# Patient Record
Sex: Male | Born: 1945 | Race: White | Hispanic: No | Marital: Single | Smoking: Former smoker
Health system: Southern US, Community
[De-identification: ages and names within clinical notes are randomized; demographics above are authoritative.]

## PROBLEM LIST (undated history)

## (undated) DIAGNOSIS — G4730 Sleep apnea, unspecified: Secondary | ICD-10-CM

## (undated) DIAGNOSIS — M1990 Unspecified osteoarthritis, unspecified site: Secondary | ICD-10-CM

## (undated) DIAGNOSIS — K219 Gastro-esophageal reflux disease without esophagitis: Secondary | ICD-10-CM

## (undated) HISTORY — PX: BACK SURGERY: SHX140

---

## 2016-12-16 DIAGNOSIS — W57XXXA Bitten or stung by nonvenomous insect and other nonvenomous arthropods, initial encounter: Secondary | ICD-10-CM | POA: Diagnosis not present

## 2016-12-16 DIAGNOSIS — S30860A Insect bite (nonvenomous) of lower back and pelvis, initial encounter: Secondary | ICD-10-CM | POA: Diagnosis not present

## 2017-05-06 DIAGNOSIS — R1013 Epigastric pain: Secondary | ICD-10-CM | POA: Diagnosis not present

## 2017-05-06 DIAGNOSIS — R030 Elevated blood-pressure reading, without diagnosis of hypertension: Secondary | ICD-10-CM | POA: Diagnosis not present

## 2017-07-25 DIAGNOSIS — L821 Other seborrheic keratosis: Secondary | ICD-10-CM | POA: Diagnosis not present

## 2017-07-25 DIAGNOSIS — M25562 Pain in left knee: Secondary | ICD-10-CM | POA: Diagnosis not present

## 2017-07-25 DIAGNOSIS — L989 Disorder of the skin and subcutaneous tissue, unspecified: Secondary | ICD-10-CM | POA: Diagnosis not present

## 2017-08-26 ENCOUNTER — Encounter: Payer: Self-pay | Admitting: Orthopaedic Surgery

## 2017-08-26 ENCOUNTER — Ambulatory Visit (INDEPENDENT_AMBULATORY_CARE_PROVIDER_SITE_OTHER): Payer: Medicare Other | Admitting: Orthopaedic Surgery

## 2017-08-26 VITALS — BP 127/86 | HR 103 | Temp 97.4°F | Ht 66.0 in | Wt 247.0 lb

## 2017-08-26 DIAGNOSIS — G8929 Other chronic pain: Secondary | ICD-10-CM

## 2017-08-26 DIAGNOSIS — M25562 Pain in left knee: Secondary | ICD-10-CM

## 2017-08-26 MED ORDER — MELOXICAM 7.5 MG PO TABS: 7.5000 mg | ORAL_TABLET | Freq: Every day | ORAL | 2 refills | Status: DC

## 2017-08-26 NOTE — Progress Notes (Signed)
Subjective:    Patient ID: Peter Mora, male    DOB: 08-Feb-1946, 71 y.o.   MRN: [id number]  HPI He has had pain of the left knee for several months.  He has had swelling and popping and feeling it will give way but has not.  He has seen his family doctor for this.  He has tried ice and heat and brace.  The pain got worse this past weekend and he went to the ER at [place] were done and showed just mild DJD, no fracture.  He was given Mobic 7.5 which helped.  He was told he would need MRI.  I will continue the Mobic and schedule for MRI.    I have reviewed the notes and x-rays from [place]: Negative for congestion.   Respiratory: Negative for cough and shortness of breath.   Cardiovascular: Negative for chest pain and leg swelling.  Endocrine: Negative for cold intolerance.  Musculoskeletal: Positive for arthralgias, gait problem and joint swelling.  Allergic/Immunologic: Negative for environmental allergies.   History reviewed. No pertinent past medical history.  History reviewed. No pertinent surgical history.  No current outpatient prescriptions on file prior to visit.   No current facility-administered medications on file prior to visit.     Social History   Social History  . Marital status: Married    Spouse name: N/A  . Number of children: N/A  . Years of education: N/A   Occupational History  . Not on file.   Social History Main Topics  . Smoking status: Never Smoker  . Smokeless tobacco: Not on file  . Alcohol use Not on file  . Drug use: Unknown  . Sexual activity: Not on file   Other Topics Concern  . Not on file   Social History Narrative  . No narrative on file    Family History  Problem Relation Age of Onset  . Cancer Mother   . Cancer Sister   . Cancer Brother     BP 127/86   Pulse (!) 103   Temp (!) 97.4 F (36.3 C)   Ht 5\' 6"  (1.676 m)   Wt 247 lb (112 kg)   BMI 39.87 kg/m     Objective:   Physical Exam  Constitutional: He is oriented to person, place, and time. He appears well-developed and well-nourished.  HENT:  Head: Normocephalic and atraumatic.  Eyes: Pupils are equal, round, and reactive to light. Conjunctivae and EOM are normal.  Neck: Normal range of motion. Neck supple.  Cardiovascular: Normal rate, regular rhythm and intact distal pulses.   Pulmonary/Chest: Effort normal.  Abdominal: Soft.  Musculoskeletal: He exhibits tenderness (the left knee has tenderness more medially, ROM 0 to 105, positive medial McMurray, limp left, sliight effusion, crepitus also present.  Right knee negative.).  Neurological: He is alert and oriented to person, place, and time. He has normal reflexes. He displays normal reflexes. No cranial nerve deficit. He exhibits normal muscle tone. Coordination normal.  Skin: Skin is warm and dry.  Psychiatric: He has a normal mood and affect. His behavior is normal. Judgment and thought content normal.  Vitals reviewed.         Assessment & Plan:   Encounter Diagnosis  Name Primary?  . Chronic pain of left knee Yes   MRI is scheduled.  Return next week.  I have discussed possible arthroscopy if that is needed.  New Rx for Mobic given  7.5 mgm daily.  Call if any problem.  Precautions discussed.   Electronically Signed Sanjuana Kava, MD 10/9/20182:40 PM

## 2017-08-27 ENCOUNTER — Ambulatory Visit: Payer: Medicare Other | Admitting: Orthopaedic Surgery

## 2017-08-28 ENCOUNTER — Ambulatory Visit (HOSPITAL_COMMUNITY)
Admission: RE | Admit: 2017-08-28 | Discharge: 2017-08-28 | Disposition: A | Discharge status: Home / Self Care | Payer: Medicare Other | Source: Ambulatory Visit | Attending: Orthopaedic Surgery | Admitting: Orthopaedic Surgery

## 2017-08-28 DIAGNOSIS — S83242A Other tear of medial meniscus, current injury, left knee, initial encounter: Secondary | ICD-10-CM | POA: Diagnosis not present

## 2017-08-28 DIAGNOSIS — M25562 Pain in left knee: Secondary | ICD-10-CM

## 2017-08-28 DIAGNOSIS — M1712 Unilateral primary osteoarthritis, left knee: Secondary | ICD-10-CM | POA: Diagnosis not present

## 2017-08-28 DIAGNOSIS — X58XXXA Exposure to other specified factors, initial encounter: Secondary | ICD-10-CM | POA: Insufficient documentation

## 2017-08-28 DIAGNOSIS — G8929 Other chronic pain: Secondary | ICD-10-CM

## 2017-09-03 ENCOUNTER — Ambulatory Visit (INDEPENDENT_AMBULATORY_CARE_PROVIDER_SITE_OTHER): Payer: Medicare Other | Admitting: Orthopaedic Surgery

## 2017-09-03 ENCOUNTER — Encounter: Payer: Self-pay | Admitting: Orthopaedic Surgery

## 2017-09-03 VITALS — BP 136/92 | HR 68 | Temp 97.1°F | Ht 66.0 in | Wt 245.0 lb

## 2017-09-03 DIAGNOSIS — G8929 Other chronic pain: Secondary | ICD-10-CM

## 2017-09-03 DIAGNOSIS — M25562 Pain in left knee: Secondary | ICD-10-CM

## 2017-09-03 NOTE — Progress Notes (Signed)
Patient QQ:IWLNLGX Mora, male DOB:01/23/1946, 71 y.o. QJJ:[id number]  Chief Complaint  Patient presents with  . Results    MRI Left Knee    HPI  Peter Mora is a 71 y.o. male who has left knee pain.  He had MRI which showed: IMPRESSION: Horizontal tear posterior horn medial meniscus has components reaching both the femoral and tibial articular surfaces. The body of the medial meniscus is somewhat degenerated with fraying along its free edge.  Mucoid degeneration of the ACL without tear.  Mild to moderate patellofemoral and medial compartment Osteoarthritis.  I have explained the findings of the MRI to him.  I have recommended arthroscopy of the knee.  I will have him see Dr. Aline Brochure for the surgery.  He is agreeable.  HPI  Body mass index is 39.54 kg/m.  ROS  Review of Systems  HENT: Negative for congestion.   Respiratory: Negative for cough and shortness of breath.   Cardiovascular: Negative for chest pain and leg swelling.  Endocrine: Negative for cold intolerance.  Musculoskeletal: Positive for arthralgias, gait problem and joint swelling.  Allergic/Immunologic: Negative for environmental allergies.    History reviewed. No pertinent past medical history.  History reviewed. No pertinent surgical history.  Family History  Problem Relation Age of Onset  . Cancer Mother   . Cancer Sister   . Cancer Brother     Social History Social History  Substance Use Topics  . Smoking status: Never Smoker  . Smokeless tobacco: Never Used  . Alcohol use Not on file    No Known Allergies  Current Outpatient Prescriptions  Medication Sig Dispense Refill  . meloxicam (MOBIC) 7.5 MG tablet Take 1 tablet (7.5 mg total) by mouth daily. 30 tablet 2   No current facility-administered medications for this visit.      Physical Exam  Blood pressure (!) 136/92, pulse 68, temperature (!) 97.1 F (36.2 C), height 5\' 6"  (1.676 m), weight 245 lb (111.1  kg).  Constitutional: overall normal hygiene, normal nutrition, well developed, normal grooming, normal body habitus. Assistive device:hinge knee brace  Musculoskeletal: gait and station Limp left, muscle tone and strength are normal, no tremors or atrophy is present.  .  Neurological: coordination overall normal.  Deep tendon reflex/nerve stretch intact.  Sensation normal.  Cranial nerves II-XII intact.   Skin:   Normal overall no scars, lesions, ulcers or rashes. No psoriasis.  Psychiatric: Alert and oriented x 3.  Recent memory intact, remote memory unclear.  Normal mood and affect. Well groomed.  Good eye contact.  Cardiovascular: overall no swelling, no varicosities, no edema bilaterally, normal temperatures of the legs and arms, no clubbing, cyanosis and good capillary refill.  Lymphatic: palpation is normal.  All other systems reviewed and are negative   Left knee has ROM of 0 to 105, crepitus, effusion 1+, pain medial joint line, positive medial McMurray, limp to the left.  NV intact.  The patient has been educated about the nature of the problem(s) and counseled on treatment options.  The patient appeared to understand what I have discussed and is in agreement with it.  Encounter Diagnosis  Name Primary?  . Chronic pain of left knee Yes    PLAN Call if any problems.  Precautions discussed.  Continue current medications.   Return to clinic to see Dr. Aline Brochure for possible knee surgery.   Electronically Signed Sanjuana Kava, MD 10/17/20188:48 AM

## 2017-09-09 ENCOUNTER — Encounter: Payer: Self-pay | Admitting: *Deleted

## 2017-09-09 ENCOUNTER — Encounter: Payer: Self-pay | Admitting: Orthopedic Surgery

## 2017-09-09 ENCOUNTER — Ambulatory Visit (INDEPENDENT_AMBULATORY_CARE_PROVIDER_SITE_OTHER): Payer: Medicare Other | Admitting: Orthopedic Surgery

## 2017-09-09 VITALS — BP 118/68 | HR 72 | Ht 66.0 in | Wt 247.0 lb

## 2017-09-09 DIAGNOSIS — M23329 Other meniscus derangements, posterior horn of medial meniscus, unspecified knee: Secondary | ICD-10-CM | POA: Diagnosis not present

## 2017-09-09 DIAGNOSIS — M1712 Unilateral primary osteoarthritis, left knee: Secondary | ICD-10-CM

## 2017-09-09 MED ORDER — MELOXICAM 7.5 MG PO TABS: 7.5000 mg | ORAL_TABLET | Freq: Every day | ORAL | 2 refills | Status: DC

## 2017-09-09 NOTE — Progress Notes (Signed)
Progress Note   Patient ID: Peter Mora, male   DOB: [birth year], 71 y.o.   MRN: [id number]  Chief Complaint  Patient presents with  . 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 He's basically had pain for a year worse in the last 2 months. He tried meloxicam did not help.  He had several issues with the knee including medial pain no swelling, Moderatepain with pivoting and turning and a catching sensation.  His situation has not improved and he would like to proceed with arthroscopic surgery left knee  (Dr Brooke Bonito history: He has had pain of the left knee for several months. He has had swelling and popping and feeling it will give way but has not. He has seen his family doctor for this. He has tried ice and heat and brace. The pain got worse this past weekend and he went to the ER at [place] were done and showed just mild DJD, no fracture. He was given Mobic 7.5 which helped. He was told he would need MRI. I will continue the Mobic and schedule for MRI.)      Review of Systems  Constitutional: Negative for chills, fever, malaise/fatigue and weight loss.  HENT: Negative.   Respiratory: Negative.   Cardiovascular: Negative.   Musculoskeletal: Positive for joint pain.  Skin: Negative.   All other systems reviewed and are negative.  Current Meds  Medication Sig  . meloxicam (MOBIC) 7.5 MG tablet Take 1 tablet (7.5 mg total) by mouth daily.   Medical history no hypertension diabetes  No history of prior surgery   Social History  Substance Use Topics  . Smoking status: Never Smoker  . Smokeless tobacco: Never Used  . Alcohol use Not on file   Family History  Problem Relation Age of Onset  . Cancer Mother   . Cancer Sister   . Cancer Brother      No Known Allergies  BP 118/68   Pulse 72   Ht 5\' 6"  (1.676 m)    Wt 247 lb (112 kg)   BMI 39.87 kg/m    Physical Exam Gen. appearance the patient's appearance is normal with normal grooming and  hygiene The patient is oriented to person place and time Mood and affect are normal   Ortho Exam  Ambulation is normal. Right knee normal range of motion, ligaments are stable. Motor strength grade 5 over 5 skin normal sensation was intact there was no swelling or tenderness   Left knee Medial joint line tenderness is noted without effusion. He has full extension and flexion. Collateral and cruciate ligaments are stable to stress testing. Quadricep strength was graded 5 over 5.  Skin was warm dry and intact without nodules or rash  Sensation was normal he did have bilateral mild pitting edema  Medical decision-making Encounter Diagnoses  Name Primary?  . Derangement of posterior horn of medial meniscus, unspecified laterality Yes  . Primary osteoarthritis of left knee    MRI  IMPRESSION: Horizontal tear posterior horn medial meniscus has components reaching both the femoral and tibial articular surfaces. The body of the medial meniscus is somewhat degenerated with fraying along its free edge.   Mucoid degeneration of the ACL without tear.   Mild to moderate patellofemoral and medial compartment osteoarthritis.     Electronically Signed   By: Inge Rise  M.D.   On: 08/28/2017 09:40  I REVIEWED THIS mri I AGREED HE HAS MILD ARTHRITIS AND I DISCUSSED THIS WITH HIM. This may cause him some difficulty in the future.  His medial meniscus tear is noted as well   Arthroscopy, left knee partial medial meniscectomy: scheduled for Nov 1 st   The procedure has been fully reviewed with the patient; The risks and benefits of surgery have been discussed and explained and understood. Alternative treatment has also been reviewed, questions were encouraged and answered. The postoperative plan is also been reviewed.  Meds ordered this encounter   Medications  . meloxicam (MOBIC) 7.5 MG tablet    Sig: Take 1 tablet (7.5 mg total) by mouth daily.    Dispense:  30 tablet    Refill:  2     Arther Abbott, MD 09/09/2017 8:45 AM

## 2017-09-09 NOTE — Patient Instructions (Signed)
Meniscus Injury, Arthroscopy Arthroscopy is a surgical procedure that involves the use of a small scope that has a camera and surgical instruments on the end (arthroscope). An arthroscope can be used to repair your meniscus injury.  LET YOUR HEALTH CARE PROVIDER KNOW ABOUT:  Any allergies you have.  All medicines you are taking, including vitamins, herbs, eyedrops, creams, and over-the-counter medicines.  Any recent colds or infections you have had or currently have.  Previous problems you or members of your family have had with the use of anesthetics.  Any blood disorders or blood clotting problems you have.  Previous surgeries you have had.  Medical conditions you have. RISKS AND COMPLICATIONS Generally, this is a safe procedure. However, as with any procedure, problems can occur. Possible problems include:  Damage to nerves or blood vessels.  Excess bleeding.  Blood clots.  Infection. BEFORE THE PROCEDURE  Do not eat or drink for 6-8 hours before the procedure.  Take medicines as directed by your surgeon. Ask your surgeon about changing or stopping your regular medicines.  You may have lab tests the morning of surgery. PROCEDURE  You will be given one of the following:   A medicine that numbs the area (local anesthesia).  A medicine that makes you go to sleep (general anesthesia).  A medicine injected into your spine that numbs your body below the waist (spinal anesthesia). Most often, several small cuts (incisions) are made in the knee. The arthroscope and instruments go into the incisions to repair the damage. The torn portion of the meniscus is removed.   AFTER THE PROCEDURE  You will be taken to the recovery area where your progress will be monitored. When you are awake, stable, and taking fluids without complications, you will be allowed to go home. This is usually the same day. A torn or stretched ligament (ligament sprain) may take 6-8 weeks to heal.   It  takes about the 4-6 WEEKS if your surgeon removed a torn meniscus.  A repaired meniscus may require 6-12 weeks of recovery time.  A torn ligament needing reconstructive surgery may take 6-12 months to heal fully.   This information is not intended to replace advice given to you by your health care provider. Make sure you discuss any questions you have with your health care provider. You have decided to proceed with operative arthroscopy of the knee. You have decided not to continue with nonoperative measures such as but not limited to oral medication, weight loss, activity modification, physical therapy, bracing, or injection.  We will perform operative arthroscopy of the knee. Some of the risks associated with arthroscopic surgery of the knee include but are not limited to Bleeding Infection Swelling Stiffness Blood clot Pain  If you're not comfortable with these risks and would like to continue with nonoperative treatment please let Dr. Harrison know prior to your surgery.   Document Released: 11/01/2000 Document Revised: 11/09/2013 Document Reviewed: 04/02/2013 Elsevier Interactive Patient Education 2016 [employer] You have decided to proceed with operative arthroscopy of the knee. You have decided not to continue with nonoperative measures such as but not limited to oral medication, weight loss, activity modification, physical therapy, bracing, or injection.  We will perform operative arthroscopy of the knee. Some of the risks associated with arthroscopic surgery of the knee include but are not limited to Bleeding Infection Swelling Stiffness Blood clot Pain  If you're not comfortable with these risks and would like to continue with nonoperative treatment please let Dr. Harrison   know prior to your surgery. 

## 2017-09-09 NOTE — Progress Notes (Signed)
No precert required per Medicare guidelines for CPT 29881. Surgery scheduled for 09/18/2017. Patient notified of pre-op date and time.

## 2017-09-12 ENCOUNTER — Telehealth: Payer: Self-pay | Admitting: Radiology

## 2017-09-12 MED ORDER — MELOXICAM 15 MG PO TABS: 15.0000 mg | ORAL_TABLET | Freq: Every day | ORAL | 1 refills | Status: DC

## 2017-09-12 NOTE — Telephone Encounter (Signed)
I spoke to patient, he states too soon for his Meloxicam to be filled, you wrote for 7.5 mg dose, he states you told him bid. He has been using 2 bid, wants to know if you will give him 15 mg bid.

## 2017-09-12 NOTE — Patient Instructions (Signed)
Dezi Schaner  09/12/2017     @PREFPERIOPPHARMACY @   Your procedure is scheduled on 09/18/2017.  Report to [facility] at 10:20 A.M.  Call this number if you have problems the morning of surgery:  [phone]   Remember:  Do not eat food or drink liquids after midnight.  Take these medicines the morning of surgery with A SIP OF WATER None   Do not wear jewelry, make-up or nail polish.  Do not wear lotions, powders, or perfumes, or deoderant.  Do not shave 48 hours prior to surgery.  Men may shave face and neck.  Do not bring valuables to the hospital.  [facility] is not responsible for any belongings or valuables.  Contacts, dentures or bridgework may not be worn into surgery.  Leave your suitcase in the car.  After surgery it may be brought to your room.  For patients admitted to the hospital, discharge time will be determined by your treatment team.  Patients discharged the day of surgery will not be allowed to drive home.    Please read over the following fact sheets that you were given. Surgical Site Infection Prevention and Anesthesia Post-op Instructions     PATIENT INSTRUCTIONS POST-ANESTHESIA  IMMEDIATELY FOLLOWING SURGERY:  Do not drive or operate machinery for the first twenty four hours after surgery.  Do not make any important decisions for twenty four hours after surgery or while taking narcotic pain medications or sedatives.  If you develop intractable nausea and vomiting or a severe headache please notify your doctor immediately.  FOLLOW-UP:  Please make an appointment with your surgeon as instructed. You do not need to follow up with anesthesia unless specifically instructed to do so.  WOUND CARE INSTRUCTIONS (if applicable):  Keep a dry clean dressing on the anesthesia/puncture wound site if there is drainage.  Once the wound has quit draining you may leave it open to air.  Generally you should leave the bandage intact for twenty four hours unless  there is drainage.  If the epidural site drains for more than 36-48 hours please call the anesthesia department.  QUESTIONS?:  Please feel free to call your physician or the hospital operator if you have any questions, and they will be happy to assist you.      Knee Arthroscopy Knee arthroscopy is a surgical procedure that is used to examine the inside of your knee joint and repair any damage. The surgeon puts a small, lighted instrument with a camera on the tip (arthroscope) through a small incision in your knee. The camera sends pictures to a monitor in the operating room. Your surgeon uses those pictures to guide the surgical instruments through other incisions to the area of damage. Knee arthroscopy can be used to treat many types of knee problems. It may be used:  To repair a torn ligament.  To repair or remove damaged tissue.  To remove a fluid-filled sac (cyst) from your knee.  Tell a health care provider about:  Any allergies you have.  All medicines you are taking, including vitamins, herbs, eye drops, creams, and over-the-counter medicines.  Any problems you or family members have had with anesthetic medicines.  Any blood disorders you have.  Any surgeries you have had.  Any medical conditions you have. What are the risks? Generally, this is a safe procedure. However, problems may occur, including:  Infection.  Bleeding.  Damage to blood vessels, nerves, or structures of your knee.  A blood clot that forms in  your leg and travels to your lung.  Failure to relieve symptoms.  What happens before the procedure?  Ask your health care provider about: ? Changing or stopping your regular medicines. This is especially important if you are taking diabetes medicines or blood thinners. ? Taking medicines such as aspirin and ibuprofen. These medicines can thin your blood. Do not take these medicines before your procedure if your health care provider instructs you not  to.  Follow your health care provider's instructions about eating or drinking restrictions.  Plan to have someone take you home after the procedure.  If you go home right after the procedure, plan to have someone with you for 24 hours.  Do not drink alcohol unless your health care provider says that you can.  Do not use any tobacco products, including cigarettes, chewing tobacco, or electronic cigarettes unless your health care provider says that you can. If you need help quitting, ask your health care provider.  You may have a physical exam. What happens during the procedure?  An IV tube will be inserted into one of your veins.  You will be given one or more of the following: ? A medicine that helps you relax (sedative). ? A medicine that numbs the area (local anesthetic). ? A medicine that makes you fall asleep (general anesthetic). ? A medicine that is injected into your spine that numbs the area below and slightly above the injection site (spinal anesthetic). ? A medicine that is injected into an area of your body that numbs everything below the injection site (regional anesthetic).  A cuff may be placed around your upper leg to slow bleeding during the procedure.  The surgeon will make a small number of incisions around your knee.  Your knee joint will be flushed and filled with a germ-free (sterile) solution.  The arthroscope will be passed through an incision into your knee joint.  More instruments will be passed through other incisions to repair your knee as needed.  The fluid will be removed from your knee.  The incisions will be closed with adhesive strips or stitches (sutures).  A bandage (dressing) will be placed over your knee. The procedure may vary among health care providers and hospitals. What happens after the procedure?  Your blood pressure, heart rate, breathing rate and blood oxygen level will be monitored often until the medicines you were given have  worn off.  You may be given medicine for pain.  You may get crutches to help you walk without using your knee to support your body weight.  You may have to wear compression stockings. These stocking help to prevent blood clots and reduce swelling in your legs. This information is not intended to replace advice given to you by your health care provider. Make sure you discuss any questions you have with your health care provider. Document Released: 11/01/2000 Document Revised: 04/11/2016 Document Reviewed: 10/31/2014 Elsevier Interactive Patient Education  2017 [employer].

## 2017-09-12 NOTE — Telephone Encounter (Signed)
Discussed with Dr Viviano Simas may use maximum of 15mg  / day, I have called him to discuss. He states Dr Luna Glasgow told him he could use 15 bid, I have advised this is not what Dr Aline Brochure recommends, he has voiced understanding, and states he will use once a day.

## 2017-09-15 ENCOUNTER — Other Ambulatory Visit: Payer: Self-pay

## 2017-09-15 ENCOUNTER — Encounter (HOSPITAL_COMMUNITY)
Admission: RE | Admit: 2017-09-15 | Discharge: 2017-09-15 | Disposition: A | Discharge status: Home / Self Care | Payer: Medicare Other | Source: Ambulatory Visit | Attending: Orthopedic Surgery | Admitting: Orthopedic Surgery

## 2017-09-15 ENCOUNTER — Telehealth: Payer: Self-pay | Admitting: Orthopedic Surgery

## 2017-09-15 ENCOUNTER — Encounter (HOSPITAL_COMMUNITY): Payer: Self-pay

## 2017-09-15 DIAGNOSIS — I4510 Unspecified right bundle-branch block: Secondary | ICD-10-CM | POA: Insufficient documentation

## 2017-09-15 DIAGNOSIS — I471 Supraventricular tachycardia: Secondary | ICD-10-CM | POA: Diagnosis not present

## 2017-09-15 DIAGNOSIS — M1990 Unspecified osteoarthritis, unspecified site: Secondary | ICD-10-CM | POA: Insufficient documentation

## 2017-09-15 DIAGNOSIS — Z01810 Encounter for preprocedural cardiovascular examination: Secondary | ICD-10-CM | POA: Diagnosis not present

## 2017-09-15 DIAGNOSIS — L821 Other seborrheic keratosis: Secondary | ICD-10-CM | POA: Diagnosis not present

## 2017-09-15 DIAGNOSIS — Z01812 Encounter for preprocedural laboratory examination: Secondary | ICD-10-CM | POA: Insufficient documentation

## 2017-09-15 HISTORY — DX: Unspecified osteoarthritis, unspecified site: M19.90

## 2017-09-15 NOTE — Telephone Encounter (Signed)
Kim from [facility] called stating that patient's surgery is pending due to needing clearance from Cardiologist. He has an appointment on Thursday 11/1 @ 2:30 pm. She stated that Dr. Aline Brochure is aware.

## 2017-09-15 NOTE — Progress Notes (Signed)
   09/15/17 1316  OBSTRUCTIVE SLEEP APNEA  Have you ever been diagnosed with sleep apnea through a sleep study? No  Do you snore loudly (loud enough to be heard through closed doors)?  1  Do you often feel tired, fatigued, or sleepy during the daytime (such as falling asleep during driving or talking to someone)? 0  Has anyone observed you stop breathing during your sleep? 1  Do you have, or are you being treated for high blood pressure? 0  BMI more than 35 kg/m2? 1  Age > 50 (1-yes) 1  Neck circumference greater than:Male 16 inches or larger, Male 17inches or larger? 0  Male Gender (Yes=1) 1  Obstructive Sleep Apnea Score 5

## 2017-09-15 NOTE — Pre-Procedure Instructions (Signed)
Patient in for PAT. Heart rate on monitor ranges from 36-69. Upon doing EKG, it was discovered that he is in [place]. He states he has never seen a cardiologist and is on no cardiac drugs. EKG shown to Dr Patsey Berthold who wants patient to be evaluated by cardiology prior to surgery. Appointment set up for patient to see cardiology on Thursday, November 1 @ 2:40. Explained everything to patient who verbalized understanding of reason for cardiology consult. Dr Aline Brochure notified, as well as his office, that patient will be postponed until he is evaluated by cardiology.

## 2017-09-15 NOTE — Telephone Encounter (Addendum)
Thank you, I will keep a look out for the Cardiac clearance, once he is cleared, Dr Aline Brochure will book the case. He was posted for 09/18/17

## 2017-09-18 ENCOUNTER — Ambulatory Visit (INDEPENDENT_AMBULATORY_CARE_PROVIDER_SITE_OTHER): Payer: Medicare Other | Admitting: Cardiovascular Disease

## 2017-09-18 ENCOUNTER — Encounter (HOSPITAL_COMMUNITY): Admission: RE | Payer: Self-pay | Source: Ambulatory Visit

## 2017-09-18 ENCOUNTER — Other Ambulatory Visit (HOSPITAL_COMMUNITY)
Admission: RE | Admit: 2017-09-18 | Discharge: 2017-09-18 | Disposition: A | Discharge status: Home / Self Care | Payer: Medicare Other | Source: Ambulatory Visit | Attending: Cardiovascular Disease | Admitting: Cardiovascular Disease

## 2017-09-18 ENCOUNTER — Encounter: Payer: Self-pay | Admitting: Cardiovascular Disease

## 2017-09-18 ENCOUNTER — Ambulatory Visit (HOSPITAL_COMMUNITY): Admission: RE | Admit: 2017-09-18 | Payer: Medicare Other | Source: Ambulatory Visit | Admitting: Orthopedic Surgery

## 2017-09-18 VITALS — BP 116/74 | HR 81 | Ht 66.0 in | Wt 247.0 lb

## 2017-09-18 DIAGNOSIS — I471 Supraventricular tachycardia: Secondary | ICD-10-CM | POA: Diagnosis not present

## 2017-09-18 DIAGNOSIS — R9431 Abnormal electrocardiogram [ECG] [EKG]: Secondary | ICD-10-CM

## 2017-09-18 DIAGNOSIS — M1990 Unspecified osteoarthritis, unspecified site: Secondary | ICD-10-CM | POA: Diagnosis not present

## 2017-09-18 DIAGNOSIS — Z01818 Encounter for other preprocedural examination: Secondary | ICD-10-CM

## 2017-09-18 DIAGNOSIS — Z01812 Encounter for preprocedural laboratory examination: Secondary | ICD-10-CM | POA: Diagnosis not present

## 2017-09-18 DIAGNOSIS — Z01810 Encounter for preprocedural cardiovascular examination: Secondary | ICD-10-CM

## 2017-09-18 DIAGNOSIS — I498 Other specified cardiac arrhythmias: Secondary | ICD-10-CM | POA: Diagnosis not present

## 2017-09-18 DIAGNOSIS — I4510 Unspecified right bundle-branch block: Secondary | ICD-10-CM | POA: Diagnosis not present

## 2017-09-18 DIAGNOSIS — L821 Other seborrheic keratosis: Secondary | ICD-10-CM | POA: Diagnosis not present

## 2017-09-18 LAB — BASIC METABOLIC PANEL
Anion gap: 9 (ref 5–15)
BUN: 22 mg/dL — AB (ref 6–20)
CO2: 23 mmol/L (ref 22–32)
CREATININE: 0.97 mg/dL (ref 0.61–1.24)
Calcium: 8.7 mg/dL — ABNORMAL LOW (ref 8.9–10.3)
Chloride: 103 mmol/L (ref 101–111)
GFR calc Af Amer: >60 mL/min (ref >60)
GLUCOSE: 90 mg/dL (ref 65–99)
Potassium: 3.9 mmol/L (ref 3.5–5.1)
SODIUM: 135 mmol/L (ref 135–145)

## 2017-09-18 LAB — MAGNESIUM: Magnesium: 2.1 mg/dL (ref 1.7–2.4)

## 2017-09-18 LAB — TSH: TSH: 3.505 u[IU]/mL (ref 0.350–4.500)

## 2017-09-18 SURGERY — ARTHROSCOPY, KNEE, WITH MEDIAL MENISCECTOMY
Anesthesia: General | Laterality: Left

## 2017-09-18 NOTE — Progress Notes (Signed)
CARDIOLOGY CONSULT NOTE  Patient ID: Peter Mora MRN: [id number] DOB/AGE: 69-Oct-1947 69 y.o.  Admit date: (Not on file) Primary Physician: Patient, No Pcp Per Referring Physician: Dr. Aline Brochure  Reason for Consultation: Abnormal EKG and preoperative risk stratification  HPI: Peter Mora is a 71 y.o. male who is being seen today for the evaluation of an abnormal EKG at the request of Dr. Aline Brochure.  He was evaluated by orthopedic surgery on 09/09/17 for consideration of left knee surgery for a torn medial meniscus.  He has no cardiovascular risk factors such as hypertension, diabetes, or hyperlipidemia.  ECG on 09/15/17 which I personally interpreted demonstrated sinus rhythm with incomplete right bundle branch block, PACs and PVCs, and artifact with anterolateral T wave abnormalities.  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  He could climb a flight of stairs and walk on level ground without any exertional chest pain or shortness of breath.  He denies leg swelling, palpitations, dizziness, lightheadedness, orthopnea, and paroxysmal nocturnal dyspnea.  There is no family history of coronary artery disease whatsoever that he is aware of.  Social history: He is married and has 4 children.  His eldest son went to the [employer] on a full scholarship and is an Chief Financial Officer.  The patient lives in [place], [place], for about 25 years.  He has been living in [place] for 12 years.  He enjoys Proofreader.   No Known Allergies  Current Outpatient Prescriptions  Medication Sig Dispense Refill  . hydrocortisone cream 1 % Apply 1 application topically See admin instructions. Apply to scalp daily after shower    . meloxicam (MOBIC) 15 MG tablet Take 1 tablet (15 mg total) by mouth daily. 30 tablet 1   No current facility-administered medications for this visit.     Past Medical History:  Diagnosis Date  . Arthritis      History reviewed. No pertinent surgical history.  Social History   Social History  . Marital status: Married    Spouse name: N/A  . Number of children: N/A  . Years of education: N/A   Occupational History  . Not on file.   Social History Main Topics  . Smoking status: Former Smoker    Years: 35.00    Types: Cigarettes    Quit date: 09/15/2002  . Smokeless tobacco: Never Used  . Alcohol use 3.6 oz/week    3 Cans of beer, 3 Shots of liquor per week  . Drug use: No  . Sexual activity: Not Currently    Birth control/ protection: None   Other Topics Concern  . Not on file   Social History Narrative  . No narrative on file     No family history of premature CAD in 1st degree relatives.  Current Meds  Medication Sig  . hydrocortisone cream 1 % Apply 1 application topically See admin instructions. Apply to scalp daily after shower  . meloxicam (MOBIC) 15 MG tablet Take 1 tablet (15 mg total) by mouth daily.      Review of systems complete and found to be negative unless listed above in HPI    Physical exam Blood pressure 116/74, pulse 81, height 5\' 6"  (1.676 m), weight 247 lb (112 kg), SpO2 98 %. General: NAD Neck: No JVD, no thyromegaly or thyroid nodule.  Lungs: Clear to auscultation bilaterally with normal respiratory effort. CV: Nondisplaced PMI. Regular rate and rhythm, normal S1/S2, no S3/S4, no murmur.  No  peripheral edema.  No carotid bruit.    Abdomen: Soft, nontender, protuberant Skin: Intact without lesions or rashes.  Neurologic: Alert and oriented x 3.  Psych: Normal affect. Extremities: No clubbing or cyanosis.  HEENT: Normal.   ECG: Most recent ECG reviewed.   Labs: No results found for: K, BUN, CREATININE, ALT, TSH, HGB   Lipids: No results found for: LDLCALC, LDLDIRECT, CHOL, TRIG, HDL      ASSESSMENT AND PLAN:  1.  Abnormal EKG and preoperative risk stratification: He is entirely asymptomatic and has no significant cardiovascular  risk factors.  In order to evaluate for arrhythmia, I will obtain a basic metabolic panel, TSH, and magnesium level.  I will obtain an echocardiogram to evaluate cardiac structure and function.  I do not feel he requires stress testing prior to undergoing knee surgery as he appears to be at a low perioperative risk for major adverse cardiac events.    Disposition: Follow up to be determined   Signed: Kate Sable, M.D., F.A.C.C.  09/18/2017, 2:45 PM

## 2017-09-18 NOTE — Patient Instructions (Addendum)
Your physician recommends that you schedule a follow-up appointment in: to be determined after ECHO     Your physician has requested that you have an echocardiogram. Echocardiography is a painless test that uses sound waves to create images of your heart. It provides your doctor with information about the size and shape of your heart and how well your heart's chambers and valves are working. This procedure takes approximately one hour. There are no restrictions for this procedure.     Your physician recommends that you return for lab work in: bmet, magnesium, TSH   Dr.sue Meda Coffee  Primary care, [phone]    Thank you for choosing [facility] !

## 2017-09-19 ENCOUNTER — Ambulatory Visit (HOSPITAL_COMMUNITY)
Admission: RE | Admit: 2017-09-19 | Discharge: 2017-09-19 | Disposition: A | Discharge status: Home / Self Care | Payer: Medicare Other | Source: Ambulatory Visit | Attending: Cardiovascular Disease | Admitting: Cardiovascular Disease

## 2017-09-19 DIAGNOSIS — I498 Other specified cardiac arrhythmias: Secondary | ICD-10-CM | POA: Insufficient documentation

## 2017-09-19 LAB — ECHOCARDIOGRAM COMPLETE
CHL CUP DOP CALC LVOT VTI: 27.3 cm
CHL CUP RV SYS PRESS: 31 mmHg
E decel time: 169 msec
EERAT: 7.14
FS: 38 % (ref 28–44)
IVS/LV PW RATIO, ED: 1.03
LA ID, A-P, ES: 42 mm
LA vol A4C: 55 ml
LA vol index: 21.2 mL/m2
LADIAMINDEX: 1.8 cm/m2
LAVOL: 49.5 mL
LDCA: 3.46 cm2
LEFT ATRIUM END SYS DIAM: 42 mm
LV E/e'average: 7.14
LV PW d: 10.5 mm — AB (ref 0.6–1.1)
LV SIMPSON'S DISK: 66
LV TDI E'LATERAL: 11
LV dias vol: 77 mL (ref 62–150)
LV sys vol: 26 mL
LVDIAVOLIN: 33 mL/m2
LVEEMED: 7.14
LVELAT: 11 cm/s
LVOT diameter: 21 mm
LVOT peak grad rest: 7 mmHg
LVOT peak vel: 129 cm/s
LVOTSV: 94 mL
LVSYSVOLIN: 11 mL/m2
MV Dec: 169
MV pk A vel: 71.3 m/s
MV pk E vel: 78.5 m/s
MVPG: 2 mmHg
RV LATERAL S' VELOCITY: 14.1 cm/s
RV TAPSE: 21.7 mm
Reg peak vel: 265 cm/s
Stroke v: 51 ml
TDI e' medial: 6.64
TRMAXVEL: 265 cm/s

## 2017-09-19 NOTE — Progress Notes (Signed)
*  PRELIMINARY RESULTS* Echocardiogram 2D Echocardiogram has been performed.  Samuel Germany 09/19/2017, 10:24 AM

## 2017-09-24 NOTE — Telephone Encounter (Signed)
Patient called back regarding status of clearance for re-scheduling of arthroscopic knee surgery - states notes should be in his chart regarding clearance, per his cardiologist ([place]).  Patient's phone# [phone]

## 2017-09-24 NOTE — Telephone Encounter (Signed)
Will you review cardiology notes, I have put in a cut/ paste from the notes, but there was echo done, will you review and see if this is adequate for clearance, or if we need mor information ?

## 2017-09-24 NOTE — Telephone Encounter (Signed)
ASSESSMENT AND PLAN:  1.  Abnormal EKG and preoperative risk stratification: He is entirely asymptomatic and has no significant cardiovascular risk factors.  In order to evaluate for arrhythmia, I will obtain a basic metabolic panel, TSH, and magnesium level.  I will obtain an echocardiogram to evaluate cardiac structure and function.  I do not feel he requires stress testing prior to undergoing knee surgery as he appears to be at a low perioperative risk for major adverse cardiac events.  There is no note in for the Echo.

## 2017-09-29 ENCOUNTER — Telehealth: Payer: Self-pay | Admitting: Orthopedic Surgery

## 2017-09-29 NOTE — Telephone Encounter (Signed)
I have called him to let him know Dr Aline Brochure will have to review the notes from Cardiology. Patient states cardiology gave him the green light to have surgery, and he is anxious to schedule.   Can you post him? Insurance will not need approval he is medicare, he wants to know if you can put the case on for this week on Thursday. I told him I will let him know tomorrow when we are back in the office.

## 2017-09-29 NOTE — Telephone Encounter (Signed)
Patient wants to know if surgery has been rescheduled or not.  He stated no one has let him know what is going on.  He would like to schedule the surgery this Thursday if possible.  He also states he may have to re-do pre op again since they found a problem before.  He is unsure about this part.    Please call him and let him know what is going on.  Thanks

## 2017-09-30 ENCOUNTER — Other Ambulatory Visit: Payer: Self-pay | Admitting: Orthopedic Surgery

## 2017-09-30 NOTE — Telephone Encounter (Signed)
Peter Mora has called him with preop appt

## 2017-09-30 NOTE — Telephone Encounter (Signed)
Patient is aware. I have called him.

## 2017-09-30 NOTE — Patient Instructions (Signed)
Peter Mora  09/30/2017     @PREFPERIOPPHARMACY @   Your procedure is scheduled on  10/02/2017   Report to [facility] at  730  A.M.  Call this number if you have problems the morning of surgery:  [phone]   Remember:  Do not eat food or drink liquids after midnight.  Take these medicines the morning of surgery with A SIP OF WATER  Mobic   Do not wear jewelry, make-up or nail polish.  Do not wear lotions, powders, or perfumes, or deoderant.  Do not shave 48 hours prior to surgery.  Men may shave face and neck.  Do not bring valuables to the hospital.  [facility] is not responsible for any belongings or valuables.  Contacts, dentures or bridgework may not be worn into surgery.  Leave your suitcase in the car.  After surgery it may be brought to your room.  For patients admitted to the hospital, discharge time will be determined by your treatment team.  Patients discharged the day of surgery will not be allowed to drive home.   Name and phone number of your driver:   family Special instructions:  None  Please read over the following fact sheets that you were given. Anesthesia Post-op Instructions and Care and Recovery After Surgery      Knee Ligament Injury, Arthroscopy Arthroscopy is a surgical technique in which your health care provider examines your knee through a small, pencil-sized telescope (arthroscope). Often, repairs to injured ligaments can be done with instruments in the arthroscope. Arthroscopy is less invasive than open-knee surgery. Tell a health care provider about:  Any allergies you have.  All medicines you are taking, including vitamins, herbs, eye drops, creams, and over-the-counter medicines.  Any problems you or family members have had with anesthetic medicines.  Any blood disorders you have.  Any surgeries you have had.  Any medical conditions you have. What are the risks? Generally, this is a safe procedure.  However, as with any procedure, problems can occur. Possible problems include:  Infection.  Bleeding.  Stiffness.  What happens before the procedure?  Ask your health care provider about changing or stopping any regular medicines. Avoid taking aspirin or blood thinners as directed by your health care provider.  Do not eat or drink anything after midnight the night before surgery.  If you smoke, do not smoke for at least 2 weeks before your surgery.  Do not drink alcohol starting the day before your surgery.  Let your health care provider know if you develop a cold or any infection before your surgery.  Arrange for someone to drive you home after the surgery or after your hospital stay. Also arrange for someone to help you with activities during recovery. What happens during the procedure?  Small monitors will be put on your body. They are used to check your heart, blood pressure, and oxygen levels.  An IV access tube will be put into one of your veins. Medicine will be able to flow directly into your body through this IV tube.  You might be given a medicine to help you relax (sedative).  You will be given a medicine that makes you go to sleep (general anesthetic), and a breathing tube will be placed into your lungs during the procedure.  Several small incisions are made in your knee. Saline fluid is placed into one of the incisions to expand the knee and clear away any blood  in the knee.  Your health care provider will insert the arthroscope to examine the injured knee.  During arthroscopy, your health care provider may find a partial or complete tear in a ligament.  Tools can be inserted through the other incisions to repair the injured ligaments.  The incisions are then closed with absorbable stitches and covered with dressings. What happens after the procedure?  You will be taken to the recovery area where you will be monitored.  When you are awake, stable, and taking  fluids without problems, you will be allowed to go home. This information is not intended to replace advice given to you by your health care provider. Make sure you discuss any questions you have with your health care provider. Document Released: 11/01/2000 Document Revised: 04/11/2016 Document Reviewed: 06/16/2013 Elsevier Interactive Patient Education  2017 [place].  Arthroscopic Knee Ligament Repair, Care After This sheet gives you information about how to care for yourself after your procedure. Your health care provider may also give you more specific instructions. If you have problems or questions, contact your health care provider. What can I expect after the procedure? After the procedure, it is common to have:  Pain in your knee.  Bruising and swelling on your knee, calf, and ankle for 3-4 days.  Fatigue.  Follow these instructions at home: If you have a brace or immobilizer:  Wear the brace or immobilizer as told by your health care provider. Remove it only as told by your health care provider.  Loosen the splint or immobilizer if your toes tingle, become numb, or turn cold and blue.  Keep the brace or immobilizer clean. Bathing  Do not take baths, swim, or use a hot tub until your health care provider approves. Ask your health care provider if you can take showers.  Keep your bandage (dressing) dry until your health care provider says that it can be removed. Cover it and your brace or immobilizer with a watertight covering when you take a shower. Incision care  Follow instructions from your health care provider about how to take care of your incision. Make sure you: ? Wash your hands with soap and water before you change your bandage (dressing). If soap and water are not available, use hand sanitizer. ? Change your dressing as told by your health care provider. ? Leave stitches (sutures), skin glue, or adhesive strips in place. These skin closures may need to stay in  place for 2 weeks or longer. If adhesive strip edges start to loosen and curl up, you may trim the loose edges. Do not remove adhesive strips completely unless your health care provider tells you to do that.  Check your incision area every day for signs of infection. Check for: ? More redness, swelling, or pain. ? More fluid or blood. ? Warmth. ? Pus or a bad smell. Managing pain, stiffness, and swelling  If directed, put ice on the affected area. ? If you have a removable brace or immobilizer, remove it as told by your health care provider. ? Put ice in a plastic bag. ? Place a towel between your skin and the bag or between your brace or immobilizer and the bag. ? Leave the ice on for 20 minutes, 2-3 times a day.  Move your toes often to avoid stiffness and to lessen swelling.  Raise (elevate) the injured area above the level of your heart while you are sitting or lying down. Driving  Do not drive until your health care  provider approves. If you have a brace or immobilizer on your leg, ask your health care provider when it is safe for you to drive.  Do not drive or use heavy machinery while taking prescription pain medicine. Activity  Rest as directed. Ask your health care provider what activities are safe for you.  Do physical therapy exercises as told by your health care provider. Physical therapy will help you regain strength and motion in your knee.  Follow instructions from your health care provider about: ? When you may start motion exercises. ? When you may start riding a stationary bike and doing other low-impact activities. ? When you may start to jog and do other high-impact activities. Safety  Do not use the injured limb to support your body weight until your health care provider says that you can. Use crutches as told by your health care provider. General instructions  Do not use any products that contain nicotine or tobacco, such as cigarettes and e-cigarettes.  These can delay bone healing. If you need help quitting, ask your health care provider.  To prevent or treat constipation while you are taking prescription pain medicine, your health care provider may recommend that you: ? Drink enough fluid to keep your urine clear or pale yellow. ? Take over-the-counter or prescription medicines. ? Eat foods that are high in fiber, such as fresh fruits and vegetables, whole grains, and beans. ? Limit foods that are high in fat and processed sugars, such as fried and sweet foods.  Take over-the-counter and prescription medicines only as told by your health care provider.  Keep all follow-up visits as told by your health care provider. This is important. Contact a health care provider if:  You have more redness, swelling, or pain around an incision.  You have more fluid or blood coming from an incision.  Your incision feels warm to the touch.  You have a fever.  You have pain or swelling in your knee, and it gets worse.  You have pain that does not get better with medicine. Get help right away if:  You have trouble breathing.  You have pus or a bad smell coming from an incision.  You have numbness and tingling near the knee joint. Summary  After the procedure, it is common to have knee pain with bruising and swelling on your knee, calf, and ankle.  Icing your knee and raising your leg above the level of your heart will help control the pain and the swelling.  Do physical therapy exercises as told by your health care provider. Physical therapy will help you regain strength and motion in your knee. This information is not intended to replace advice given to you by your health care provider. Make sure you discuss any questions you have with your health care provider. Document Released: 08/25/2013 Document Revised: 10/29/2016 Document Reviewed: 10/29/2016 Elsevier Interactive Patient Education  2017 [place] Anesthesia, Adult, Care  After These instructions provide you with information about caring for yourself after your procedure. Your health care provider may also give you more specific instructions. Your treatment has been planned according to current medical practices, but problems sometimes occur. Call your health care provider if you have any problems or questions after your procedure. What can I expect after the procedure? After the procedure, it is common to have:  Vomiting.  A sore throat.  Mental slowness.  It is common to feel:  Nauseous.  Cold or shivery.  Sleepy.  Tired.  Sore  or achy, even in parts of your body where you did not have surgery.  Follow these instructions at home: For at least 24 hours after the procedure:  Do not: ? Participate in activities where you could fall or become injured. ? Drive. ? Use heavy machinery. ? Drink alcohol. ? Take sleeping pills or medicines that cause drowsiness. ? Make important decisions or sign legal documents. ? Take care of children on your own.  Rest. Eating and drinking  If you vomit, drink water, juice, or soup when you can drink without vomiting.  Drink enough fluid to keep your urine clear or pale yellow.  Make sure you have little or no nausea before eating solid foods.  Follow the diet recommended by your health care provider. General instructions  Have a responsible adult stay with you until you are awake and alert.  Return to your normal activities as told by your health care provider. Ask your health care provider what activities are safe for you.  Take over-the-counter and prescription medicines only as told by your health care provider.  If you smoke, do not smoke without supervision.  Keep all follow-up visits as told by your health care provider. This is important. Contact a health care provider if:  You continue to have nausea or vomiting at home, and medicines are not helpful.  You cannot drink fluids or start  eating again.  You cannot urinate after 8-12 hours.  You develop a skin rash.  You have fever.  You have increasing redness at the site of your procedure. Get help right away if:  You have difficulty breathing.  You have chest pain.  You have unexpected bleeding.  You feel that you are having a life-threatening or urgent problem. This information is not intended to replace advice given to you by your health care provider. Make sure you discuss any questions you have with your health care provider. Document Released: 02/10/2001 Document Revised: 04/08/2016 Document Reviewed: 10/19/2015 Elsevier Interactive Patient Education  2018 [place] Anesthesia, Adult, Care After These instructions provide you with information about caring for yourself after your procedure. Your health care provider may also give you more specific instructions. Your treatment has been planned according to current medical practices, but problems sometimes occur. Call your health care provider if you have any problems or questions after your procedure. What can I expect after the procedure? After the procedure, it is common to have:  Vomiting.  A sore throat.  Mental slowness.  It is common to feel:  Nauseous.  Cold or shivery.  Sleepy.  Tired.  Sore or achy, even in parts of your body where you did not have surgery.  Follow these instructions at home: For at least 24 hours after the procedure:  Do not: ? Participate in activities where you could fall or become injured. ? Drive. ? Use heavy machinery. ? Drink alcohol. ? Take sleeping pills or medicines that cause drowsiness. ? Make important decisions or sign legal documents. ? Take care of children on your own.  Rest. Eating and drinking  If you vomit, drink water, juice, or soup when you can drink without vomiting.  Drink enough fluid to keep your urine clear or pale yellow.  Make sure you have little or no nausea  before eating solid foods.  Follow the diet recommended by your health care provider. General instructions  Have a responsible adult stay with you until you are awake and alert.  Return to your normal activities  as told by your health care provider. Ask your health care provider what activities are safe for you.  Take over-the-counter and prescription medicines only as told by your health care provider.  If you smoke, do not smoke without supervision.  Keep all follow-up visits as told by your health care provider. This is important. Contact a health care provider if:  You continue to have nausea or vomiting at home, and medicines are not helpful.  You cannot drink fluids or start eating again.  You cannot urinate after 8-12 hours.  You develop a skin rash.  You have fever.  You have increasing redness at the site of your procedure. Get help right away if:  You have difficulty breathing.  You have chest pain.  You have unexpected bleeding.  You feel that you are having a life-threatening or urgent problem. This information is not intended to replace advice given to you by your health care provider. Make sure you discuss any questions you have with your health care provider. Document Released: 02/10/2001 Document Revised: 04/08/2016 Document Reviewed: 10/19/2015 Elsevier Interactive Patient Education  [employer].

## 2017-09-30 NOTE — Telephone Encounter (Signed)
BOOKED FOR THURS 15TH

## 2017-10-01 ENCOUNTER — Other Ambulatory Visit: Payer: Self-pay

## 2017-10-01 ENCOUNTER — Encounter (HOSPITAL_COMMUNITY): Payer: Self-pay

## 2017-10-01 ENCOUNTER — Encounter (HOSPITAL_COMMUNITY)
Admission: RE | Admit: 2017-10-01 | Discharge: 2017-10-01 | Disposition: A | Discharge status: Home / Self Care | Payer: Medicare Other | Source: Ambulatory Visit | Attending: Orthopedic Surgery | Admitting: Orthopedic Surgery

## 2017-10-01 DIAGNOSIS — G4730 Sleep apnea, unspecified: Secondary | ICD-10-CM | POA: Diagnosis not present

## 2017-10-01 DIAGNOSIS — S83242A Other tear of medial meniscus, current injury, left knee, initial encounter: Secondary | ICD-10-CM | POA: Diagnosis not present

## 2017-10-01 DIAGNOSIS — Z87891 Personal history of nicotine dependence: Secondary | ICD-10-CM | POA: Diagnosis not present

## 2017-10-01 DIAGNOSIS — X58XXXA Exposure to other specified factors, initial encounter: Secondary | ICD-10-CM | POA: Diagnosis not present

## 2017-10-01 HISTORY — DX: Sleep apnea, unspecified: G47.30

## 2017-10-01 LAB — CBC WITH DIFFERENTIAL/PLATELET
Basophils Absolute: 0 10*3/uL (ref 0.0–0.1)
Basophils Relative: 0 %
Eosinophils Absolute: 0.3 10*3/uL (ref 0.0–0.7)
Eosinophils Relative: 6 %
HEMATOCRIT: 44 % (ref 39.0–52.0)
HEMOGLOBIN: 15.1 g/dL (ref 13.0–17.0)
LYMPHS ABS: 1.3 10*3/uL (ref 0.7–4.0)
LYMPHS PCT: 27 %
MCH: 30.6 pg (ref 26.0–34.0)
MCHC: 34.3 g/dL (ref 30.0–36.0)
MCV: 89.1 fL (ref 78.0–100.0)
Monocytes Absolute: 0.5 10*3/uL (ref 0.1–1.0)
Monocytes Relative: 10 %
NEUTROS ABS: 2.6 10*3/uL (ref 1.7–7.7)
NEUTROS PCT: 56 %
Platelets: 153 10*3/uL (ref 150–400)
RBC: 4.94 MIL/uL (ref 4.22–5.81)
RDW: 13.9 % (ref 11.5–15.5)
WBC: 4.7 10*3/uL (ref 4.0–10.5)

## 2017-10-01 LAB — SURGICAL PCR SCREEN
MRSA, PCR: NEGATIVE
Staphylococcus aureus: NEGATIVE

## 2017-10-01 NOTE — H&P (Signed)
Patient ID: Peter Mora, male   DOB: [birth year], 71 y.o.   MRN: [id number]      Chief Complaint  Patient presents with  . 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 He's basically had pain for a year worse in the last 2 months. He tried meloxicam did not help.  He had several issues with the knee including medial pain no swelling, Moderatepain with pivoting and turning and a catching sensation.  His situation has not improved and he would like to proceed with arthroscopic surgery left knee  (Dr Brooke Bonito history: He has had pain of the left knee for several months. He has had swelling and popping and feeling it will give way but has not. He has seen his family doctor for this. He has tried ice and heat and brace. The pain got worse this past weekend and he went to the [facility] done and showed just mild DJD, no fracture. He was given Mobic 7.5 which helped. He was told he would need MRI. I will continue the Mobic and schedule for MRI.)      Review of Systems  Constitutional: Negative for chills, fever, malaise/fatigue and weight loss.  HENT: Negative.   Respiratory: Negative.   Cardiovascular: Negative.   Musculoskeletal: Positive for joint pain.  Skin: Negative.   All other systems reviewed and are negative.  ActiveMedications      Current Meds  Medication Sig  . meloxicam (MOBIC) 7.5 MG tablet Take 1 tablet (7.5 mg total) by mouth daily.     Medical history no hypertension diabetes  No history of prior surgery       Social History  Substance Use Topics  . Smoking status: Never Smoker  . Smokeless tobacco: Never Used  . Alcohol use Not on file        Family History  Problem Relation Age of Onset  . Cancer Mother   . Cancer Sister   . Cancer Brother      No Known  Allergies  BP 118/68   Pulse 72   Ht 5\' 6"  (1.676 m)   Wt 247 lb (112 kg)   BMI 39.87 kg/m    Physical Exam Gen. appearance the patient's appearance is normal with normal grooming and  hygiene The patient is oriented to person place and time Mood and affect are normal   Ortho Exam  Ambulation is normal. Right knee normal range of motion, ligaments are stable. Motor strength grade 5 over 5 skin normal sensation was intact there was no swelling or tenderness   Left knee Medial joint line tenderness is noted without effusion. He has full extension and flexion. Collateral and cruciate ligaments are stable to stress testing. Quadricep strength was graded 5 over 5.  Skin was warm dry and intact without nodules or rash  Sensation was normal he did have bilateral mild pitting edema  Medical decision-making     Encounter Diagnoses  Name Primary?  . Derangement of posterior horn of medial meniscus, unspecified laterality Yes  . Primary osteoarthritis of left knee    MRI  IMPRESSION: Horizontal tear posterior horn medial meniscus has components reaching both the femoral and tibial articular surfaces. The body of the medial meniscus is somewhat degenerated with fraying along its free edge.  Mucoid degeneration of the ACL without tear.  Mild  to moderate patellofemoral and medial compartment osteoarthritis.   Electronically Signed By: Inge Rise M.D. On: 08/28/2017 09:40  I REVIEWED THIS mri I AGREED HE HAS MILD ARTHRITIS AND I DISCUSSED THIS WITH HIM. This may cause him some difficulty in the future.  His medial meniscus tear is noted as well   Arthroscopy, left knee partial medial meniscectomy:   Cardiology has given clearance for the surgeryThe procedure has been fully reviewed with the patient; The risks and benefits of surgery have been discussed and explained and understood. Alternative treatment has also been reviewed, questions were  encouraged and answered. The postoperative plan is also been reviewed.

## 2017-10-02 ENCOUNTER — Encounter (HOSPITAL_COMMUNITY)
Admission: RE | Disposition: A | Discharge status: Home / Self Care | Payer: Self-pay | Source: Ambulatory Visit | Attending: Orthopedic Surgery

## 2017-10-02 ENCOUNTER — Ambulatory Visit (HOSPITAL_COMMUNITY): Payer: Medicare Other | Admitting: Anesthesiology

## 2017-10-02 ENCOUNTER — Ambulatory Visit (HOSPITAL_COMMUNITY)
Admission: RE | Admit: 2017-10-02 | Discharge: 2017-10-02 | Disposition: A | Discharge status: Home / Self Care | Payer: Medicare Other | Source: Ambulatory Visit | Attending: Orthopedic Surgery | Admitting: Orthopedic Surgery

## 2017-10-02 DIAGNOSIS — G4730 Sleep apnea, unspecified: Secondary | ICD-10-CM | POA: Insufficient documentation

## 2017-10-02 DIAGNOSIS — Z87891 Personal history of nicotine dependence: Secondary | ICD-10-CM | POA: Diagnosis not present

## 2017-10-02 DIAGNOSIS — S83242A Other tear of medial meniscus, current injury, left knee, initial encounter: Secondary | ICD-10-CM | POA: Diagnosis not present

## 2017-10-02 DIAGNOSIS — S83242D Other tear of medial meniscus, current injury, left knee, subsequent encounter: Secondary | ICD-10-CM

## 2017-10-02 DIAGNOSIS — X58XXXA Exposure to other specified factors, initial encounter: Secondary | ICD-10-CM | POA: Insufficient documentation

## 2017-10-02 HISTORY — PX: KNEE ARTHROSCOPY WITH MEDIAL MENISECTOMY: SHX5651

## 2017-10-02 SURGERY — ARTHROSCOPY, KNEE, WITH MEDIAL MENISCECTOMY
Anesthesia: General | Laterality: Left

## 2017-10-02 MED ORDER — PROPOFOL 10 MG/ML IV BOLUS
INTRAVENOUS | Status: AC
  Filled 2017-10-02: qty 40

## 2017-10-02 MED ORDER — FENTANYL CITRATE (PF) 100 MCG/2ML IJ SOLN: 25.0000 ug | INTRAMUSCULAR | Status: DC | PRN

## 2017-10-02 MED ORDER — LACTATED RINGERS IV SOLN
INTRAVENOUS | Status: DC
  Administered 2017-10-02: 08:00:00 via INTRAVENOUS

## 2017-10-02 MED ORDER — ONDANSETRON HCL 4 MG/2ML IJ SOLN
INTRAMUSCULAR | Status: AC
  Filled 2017-10-02: qty 2

## 2017-10-02 MED ORDER — EPHEDRINE SULFATE 50 MG/ML IJ SOLN
INTRAMUSCULAR | Status: AC
  Filled 2017-10-02: qty 1

## 2017-10-02 MED ORDER — LIDOCAINE HCL (CARDIAC) 10 MG/ML IV SOLN
INTRAVENOUS | Status: DC | PRN
  Administered 2017-10-02: 50 mg via INTRAVENOUS

## 2017-10-02 MED ORDER — POVIDONE-IODINE 10 % EX SWAB: 2.0000 "application " | Freq: Once | CUTANEOUS | Status: DC

## 2017-10-02 MED ORDER — HYDROCODONE-ACETAMINOPHEN 5-325 MG PO TABS: 1.0000 | ORAL_TABLET | ORAL | 0 refills | Status: DC | PRN

## 2017-10-02 MED ORDER — EPINEPHRINE PF 1 MG/ML IJ SOLN
INTRAMUSCULAR | Status: AC
  Filled 2017-10-02: qty 4

## 2017-10-02 MED ORDER — SUCCINYLCHOLINE CHLORIDE 20 MG/ML IJ SOLN
INTRAMUSCULAR | Status: AC
  Filled 2017-10-02: qty 1

## 2017-10-02 MED ORDER — SODIUM CHLORIDE 0.9 % IR SOLN
Status: DC | PRN
  Administered 2017-10-02 (×2): 3000 mL

## 2017-10-02 MED ORDER — ROCURONIUM BROMIDE 50 MG/5ML IV SOLN
INTRAVENOUS | Status: AC
  Filled 2017-10-02: qty 1

## 2017-10-02 MED ORDER — FENTANYL CITRATE (PF) 100 MCG/2ML IJ SOLN
INTRAMUSCULAR | Status: DC | PRN
  Administered 2017-10-02: 100 ug via INTRAVENOUS
  Administered 2017-10-02 (×2): 50 ug via INTRAVENOUS

## 2017-10-02 MED ORDER — FENTANYL CITRATE (PF) 100 MCG/2ML IJ SOLN
INTRAMUSCULAR | Status: AC
  Filled 2017-10-02: qty 2

## 2017-10-02 MED ORDER — ONDANSETRON HCL 4 MG/2ML IJ SOLN
4.0000 mg | Freq: Once | INTRAMUSCULAR | Status: AC
  Administered 2017-10-02: 4 mg via INTRAVENOUS

## 2017-10-02 MED ORDER — CEFAZOLIN SODIUM-DEXTROSE 2-4 GM/100ML-% IV SOLN
2.0000 g | INTRAVENOUS | Status: AC
  Administered 2017-10-02: 2 g via INTRAVENOUS
  Filled 2017-10-02: qty 100

## 2017-10-02 MED ORDER — LIDOCAINE HCL (PF) 1 % IJ SOLN
INTRAMUSCULAR | Status: AC
  Filled 2017-10-02: qty 5

## 2017-10-02 MED ORDER — MIDAZOLAM HCL 2 MG/2ML IJ SOLN
INTRAMUSCULAR | Status: AC
  Filled 2017-10-02: qty 2

## 2017-10-02 MED ORDER — BUPIVACAINE-EPINEPHRINE (PF) 0.5% -1:200000 IJ SOLN
INTRAMUSCULAR | Status: DC | PRN
  Administered 2017-10-02 (×2): 30 mL

## 2017-10-02 MED ORDER — MIDAZOLAM HCL 2 MG/2ML IJ SOLN
1.0000 mg | INTRAMUSCULAR | Status: AC
  Administered 2017-10-02: 2 mg via INTRAVENOUS

## 2017-10-02 MED ORDER — BUPIVACAINE-EPINEPHRINE (PF) 0.5% -1:200000 IJ SOLN
INTRAMUSCULAR | Status: AC
  Filled 2017-10-02: qty 60

## 2017-10-02 MED ORDER — CHLORHEXIDINE GLUCONATE 4 % EX LIQD: 60.0000 mL | Freq: Once | CUTANEOUS | Status: DC

## 2017-10-02 MED ORDER — PROPOFOL 10 MG/ML IV BOLUS
INTRAVENOUS | Status: DC | PRN
  Administered 2017-10-02: 200 mg via INTRAVENOUS

## 2017-10-02 SURGICAL SUPPLY — 47 items
ARTHROWAND PARAGON T2 (SURGICAL WAND)
BAG HAMPER (MISCELLANEOUS) ×2 IMPLANT
BANDAGE ELASTIC 6 LF NS (GAUZE/BANDAGES/DRESSINGS) ×2 IMPLANT
BLADE AGGRESSIVE PLUS 4.0 (BLADE) ×2 IMPLANT
BLADE SURG SZ11 CARB STEEL (BLADE) ×2 IMPLANT
CHLORAPREP W/TINT 26ML (MISCELLANEOUS) ×2 IMPLANT
CLOTH BEACON ORANGE TIMEOUT ST (SAFETY) ×2 IMPLANT
COOLER CRYO IC GRAV AND TUBE (ORTHOPEDIC SUPPLIES) ×2 IMPLANT
CUFF CRYO KNEE18X23 MED (MISCELLANEOUS) ×2 IMPLANT
CUFF TOURNIQUET SINGLE 34IN LL (TOURNIQUET CUFF) ×2 IMPLANT
DECANTER SPIKE VIAL GLASS SM (MISCELLANEOUS) ×4 IMPLANT
GAUZE SPONGE 4X4 12PLY STRL (GAUZE/BANDAGES/DRESSINGS) ×2 IMPLANT
GAUZE SPONGE 4X4 16PLY XRAY LF (GAUZE/BANDAGES/DRESSINGS) ×2 IMPLANT
GAUZE XEROFORM 5X9 LF (GAUZE/BANDAGES/DRESSINGS) ×2 IMPLANT
GLOVE BIOGEL M 7.0 STRL (GLOVE) ×2 IMPLANT
GLOVE BIOGEL PI IND STRL 7.0 (GLOVE) ×3 IMPLANT
GLOVE BIOGEL PI INDICATOR 7.0 (GLOVE) ×3
GLOVE SKINSENSE NS SZ8.0 LF (GLOVE) ×1
GLOVE SKINSENSE STRL SZ8.0 LF (GLOVE) ×1 IMPLANT
GLOVE SS N UNI LF 8.5 STRL (GLOVE) ×2 IMPLANT
GOWN STRL REUS W/ TWL LRG LVL3 (GOWN DISPOSABLE) ×1 IMPLANT
GOWN STRL REUS W/TWL LRG LVL3 (GOWN DISPOSABLE) ×1
GOWN STRL REUS W/TWL XL LVL3 (GOWN DISPOSABLE) ×2 IMPLANT
HLDR LEG FOAM (MISCELLANEOUS) ×1 IMPLANT
IV NS IRRIG 3000ML ARTHROMATIC (IV SOLUTION) ×4 IMPLANT
KIT BLADEGUARD II DBL (SET/KITS/TRAYS/PACK) ×2 IMPLANT
KIT ROOM TURNOVER AP CYSTO (KITS) ×2 IMPLANT
LEG HOLDER FOAM (MISCELLANEOUS) ×1
MANIFOLD NEPTUNE II (INSTRUMENTS) ×2 IMPLANT
MARKER SKIN DUAL TIP RULER LAB (MISCELLANEOUS) ×2 IMPLANT
NEEDLE HYPO 18GX1.5 BLUNT FILL (NEEDLE) ×2 IMPLANT
NEEDLE HYPO 21X1.5 SAFETY (NEEDLE) ×2 IMPLANT
NEEDLE SPNL 18GX3.5 QUINCKE PK (NEEDLE) ×2 IMPLANT
PACK ARTHRO LIMB DRAPE STRL (MISCELLANEOUS) ×2 IMPLANT
PAD ABD 5X9 TENDERSORB (GAUZE/BANDAGES/DRESSINGS) ×2 IMPLANT
PAD ARMBOARD 7.5X6 YLW CONV (MISCELLANEOUS) ×2 IMPLANT
PADDING CAST COTTON 6X4 STRL (CAST SUPPLIES) ×2 IMPLANT
PROBE BIPOLAR 50 DEGREE SUCT (MISCELLANEOUS) IMPLANT
PROBE BIPOLAR ATHRO 135MM 90D (MISCELLANEOUS) IMPLANT
SET ARTHROSCOPY INST (INSTRUMENTS) ×2 IMPLANT
SET ARTHROSCOPY PUMP TUBE (IRRIGATION / IRRIGATOR) ×2 IMPLANT
SET BASIN LINEN APH (SET/KITS/TRAYS/PACK) ×2 IMPLANT
SUT ETHILON 3 0 FSL (SUTURE) ×2 IMPLANT
SYR 30ML LL (SYRINGE) ×2 IMPLANT
SYRINGE 10CC LL (SYRINGE) ×2 IMPLANT
TUBE CONNECTING 12X1/4 (SUCTIONS) ×6 IMPLANT
WAND ARTHRO PARAGON T2 (SURGICAL WAND) IMPLANT

## 2017-10-02 NOTE — Brief Op Note (Signed)
10/02/2017  9:44 AM  PATIENT:  Peter Mora  71 y.o. male  PRE-OPERATIVE DIAGNOSIS:  Torn Medial Meniscus Left Knee  POST-OPERATIVE DIAGNOSIS:  Torn Medial Meniscus Left Knee  PROCEDURE:  Procedure(s): LEFT KNEE ARTHROSCOPY WITH PARTIAL MEDIAL MENISECTOMY (Left) 29881  SURGEON:  Surgeon(s) and Role:    Carole Civil, MD - Primary  PHYSICIAN ASSISTANT:   ASSISTANTS: none   ANESTHESIA:   General   EBL:  Total I/O In: 700 [I.V.:700] Out: 0   BLOOD ADMINISTERED:none  DRAINS: none   LOCAL MEDICATIONS USED:  Marcaine with epi 60 cc  SPECIMEN:  No Specimen  DISPOSITION OF SPECIMEN:  N/A  COUNTS:  YES  TOURNIQUET:    DICTATION: .Dragon Dictation  Knee arthroscopy dictation  The patient was identified in the preoperative holding area using 2 approved identification mechanisms. The chart was reviewed and updated. The surgical site was confirmed as left knee and marked with an indelible marker.  The patient was taken to the operating room for anesthesia. After successful general LMA anesthesia, Ancef 2 g was used as IV antibiotics.  The patient was placed in the supine position with the (left) the operative extremity in an arthroscopic leg holder and the opposite extremity in a padded leg holder.  The timeout was executed.  A lateral portal was established with an 11 blade and the scope was introduced into the joint. A diagnostic arthroscopy was performed in circumferential manner examining the entire knee joint. A medial portal was established and the diagnostic arthroscopy was repeated using a probe to palpate intra-articular structures as they were encountered.   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 The meniscal fragments were removed with a motorized shaver. The meniscus was balanced with a combination of a motorized shaver until a stable rim was obtained.   The arthroscopic pump was placed on the wash mode and any excess debris was removed from the joint using suction.  60 cc of Marcaine with epinephrine was injected through the arthroscope.  The portals were closed with 3-0 nylon suture.  A sterile bandage, Ace wrap and Cryo/Cuff was placed and the Cryo/Cuff was activated. The patient was taken to the recovery room in stable condition.   PLAN OF CARE: Discharge to home after PACU  PATIENT DISPOSITION:  PACU - hemodynamically stable.   Delay start of Pharmacological VTE agent (>24hrs) due to surgical blood loss or risk of bleeding: not applicable

## 2017-10-02 NOTE — Anesthesia Preprocedure Evaluation (Addendum)
Anesthesia Evaluation  Patient identified by MRN, date of birth, ID band Patient awake    Reviewed: Allergy & Precautions, NPO status , Patient's Chart, lab work & pertinent test results  Airway Mallampati: I  TM Distance: >3 FB Neck ROM: Full    Dental  (+) Teeth Intact   Pulmonary sleep apnea , former smoker,    breath sounds clear to auscultation       Cardiovascular  Rhythm:Regular Rate:Normal  Abnormal EKG but otherwise asymptomatic.   Neuro/Psych negative neurological ROS  negative psych ROS   GI/Hepatic negative GI ROS, Neg liver ROS,   Endo/Other  negative endocrine ROS  Renal/GU negative Renal ROS     Musculoskeletal  (+) Arthritis ,   Abdominal   Peds  Hematology negative hematology ROS (+)   Anesthesia Other Findings   Reproductive/Obstetrics                             Anesthesia Physical Anesthesia Plan  ASA: II  Anesthesia Plan: General   Post-op Pain Management:    Induction: Intravenous  PONV Risk Score and Plan:   Airway Management Planned: LMA  Additional Equipment:   Intra-op Plan:   Post-operative Plan: Extubation in OR  Informed Consent: I have reviewed the patients History and Physical, chart, labs and discussed the procedure including the risks, benefits and alternatives for the proposed anesthesia with the patient or authorized representative who has indicated his/her understanding and acceptance.     Plan Discussed with:   Anesthesia Plan Comments:        Anesthesia Quick Evaluation

## 2017-10-02 NOTE — Interval H&P Note (Signed)
History and Physical Interval Note:  10/02/2017 8:50 AM  Peter Mora  has presented today for surgery, with the diagnosis of Torn Medial Meniscus Left Knee  The various methods of treatment have been discussed with the patient and family. After consideration of risks, benefits and other options for treatment, the patient has consented to  Procedure(s): LEFT KNEE ARTHROSCOPY WITH MEDIAL MENISECTOMY (Left) as a surgical intervention .  The patient's history has been reviewed, patient examined, no change in status, stable for surgery.  I have reviewed the patient's chart and labs.  Questions were answered to the patient's satisfaction.     Arther Abbott

## 2017-10-02 NOTE — Transfer of Care (Signed)
Immediate Anesthesia Transfer of Care Note  Procedure(s) Performed: LEFT KNEE ARTHROSCOPY WITH PARTIAL MEDIAL MENISECTOMY (Left )  Patient Location: PACU  Anesthesia Type:General  Level of Consciousness: awake, alert  and patient cooperative  Airway & Oxygen Therapy: Patient Spontanous Breathing and Patient connected to nasal cannula oxygen  Post-op Assessment: Report given to RN and Post -op Vital signs reviewed and stable  Post vital signs: Reviewed and stable  Last Vitals:  Vitals:   10/02/17 0800 10/02/17 0815  BP: (!) 145/94 134/79  Resp: (!) 31 (!) 25  Temp:    SpO2:  94%    Last Pain:  Vitals:   10/02/17 0737  TempSrc: Oral      Patients Stated Pain Goal: 4 (59/74/16 3845)  Complications: No apparent anesthesia complications

## 2017-10-02 NOTE — Op Note (Signed)
10/02/2017  9:44 AM  PATIENT:  Peter Mora  71 y.o. male  PRE-OPERATIVE DIAGNOSIS:  Torn Medial Meniscus Left Knee  POST-OPERATIVE DIAGNOSIS:  Torn Medial Meniscus Left Knee  PROCEDURE:  Procedure(s): LEFT KNEE ARTHROSCOPY WITH PARTIAL MEDIAL MENISECTOMY (Left) 29881  SURGEON:  Surgeon(s) and Role:    Carole Civil, MD - Primary  PHYSICIAN ASSISTANT:   ASSISTANTS: none   ANESTHESIA:   General   EBL:  Total I/O In: 700 [I.V.:700] Out: 0   BLOOD ADMINISTERED:none  DRAINS: none   LOCAL MEDICATIONS USED:  Marcaine with epi 60 cc  SPECIMEN:  No Specimen  DISPOSITION OF SPECIMEN:  N/A  COUNTS:  YES  TOURNIQUET:    DICTATION: .Dragon Dictation  Knee arthroscopy dictation  The patient was identified in the preoperative holding area using 2 approved identification mechanisms. The chart was reviewed and updated. The surgical site was confirmed as left knee and marked with an indelible marker.  The patient was taken to the operating room for anesthesia. After successful general LMA anesthesia, 2 g Ancef was used as IV antibiotics.  The patient was placed in the supine position with the (left) the operative extremity in an arthroscopic leg holder and the opposite extremity in a padded leg holder.  The timeout was executed.  A lateral portal was established with an 11 blade and the scope was introduced into the joint. A diagnostic arthroscopy was performed in circumferential manner examining the entire knee joint. A medial portal was established and the diagnostic arthroscopy was repeated using a probe to palpate intra-articular structures as they were encountered.   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 The meniscal fragments were removed with a motorized shaver. The meniscus was balanced with a combination of a motorized shaver until a stable rim was obtained.   The arthroscopic pump was placed on the wash mode and any excess debris was removed from the joint using suction.  60 cc of Marcaine with epinephrine was injected through the arthroscope.  The portals were closed with 3-0 nylon suture.  A sterile bandage, Ace wrap and Cryo/Cuff was placed and the Cryo/Cuff was activated. The patient was taken to the recovery room in stable condition.   PLAN OF CARE: Discharge to home after PACU  PATIENT DISPOSITION:  PACU - hemodynamically stable.   Delay start of Pharmacological VTE agent (>24hrs) due to surgical blood loss or risk of bleeding: not applicable

## 2017-10-02 NOTE — Anesthesia Postprocedure Evaluation (Signed)
Anesthesia Post Note  Patient: Peter Mora  Procedure(s) Performed: LEFT KNEE ARTHROSCOPY WITH PARTIAL MEDIAL MENISECTOMY (Left )  Patient location during evaluation: Short Stay Anesthesia Type: General Level of consciousness: awake and alert Pain management: satisfactory to patient Vital Signs Assessment: post-procedure vital signs reviewed and stable Respiratory status: spontaneous breathing Cardiovascular status: stable Postop Assessment: no apparent nausea or vomiting Anesthetic complications: no     Last Vitals:  Vitals:   10/02/17 0953 10/02/17 1035  BP:  (!) 142/87  Pulse:  68  Resp:  18  Temp: 36.9 C 37 C  SpO2: 99% 98%    Last Pain:  Vitals:   10/02/17 1035  TempSrc: Oral  PainSc:                  Drucie Opitz

## 2017-10-02 NOTE — Anesthesia Procedure Notes (Signed)
Procedure Name: LMA Insertion Date/Time: 10/02/2017 9:02 AM Performed by: Vista Deck, CRNA Pre-anesthesia Checklist: Patient identified, Patient being monitored, Emergency Drugs available, Timeout performed and Suction available Patient Re-evaluated:Patient Re-evaluated prior to induction Oxygen Delivery Method: Circle System Utilized Preoxygenation: Pre-oxygenation with 100% oxygen Induction Type: IV induction Ventilation: Mask ventilation without difficulty LMA: LMA inserted LMA Size: 5.0 Number of attempts: 1 Placement Confirmation: positive ETCO2 and breath sounds checked- equal and bilateral Tube secured with: Tape Dental Injury: Teeth and Oropharynx as per pre-operative assessment

## 2017-10-03 ENCOUNTER — Encounter (HOSPITAL_COMMUNITY): Payer: Self-pay | Admitting: Orthopedic Surgery

## 2017-10-06 ENCOUNTER — Encounter: Payer: Self-pay | Admitting: Orthopedic Surgery

## 2017-10-06 ENCOUNTER — Ambulatory Visit: Payer: Medicare Other | Admitting: Orthopedic Surgery

## 2017-10-06 ENCOUNTER — Ambulatory Visit (INDEPENDENT_AMBULATORY_CARE_PROVIDER_SITE_OTHER): Payer: Self-pay | Admitting: Orthopedic Surgery

## 2017-10-06 VITALS — BP 130/81 | HR 93 | Ht 66.0 in | Wt 247.0 lb

## 2017-10-06 DIAGNOSIS — Z4889 Encounter for other specified surgical aftercare: Secondary | ICD-10-CM

## 2017-10-06 DIAGNOSIS — Z9889 Other specified postprocedural states: Secondary | ICD-10-CM

## 2017-10-06 MED ORDER — MELOXICAM 15 MG PO TABS: 15.0000 mg | ORAL_TABLET | Freq: Every day | ORAL | 1 refills | Status: DC

## 2017-10-06 NOTE — Progress Notes (Signed)
po

## 2017-10-06 NOTE — Progress Notes (Signed)
Chief Complaint  Patient presents with  . Routine Post Op    left knee  10/02/17 arthroscopy     The patient says he is feeling very good although he does still have some medial pain and difficulty climbing the steps  He comes in today slight limp no assistive device mild knee effusion.  Portal sites were cleaned stewed sutures were removed  I added some quadriceps exercises and asked him to weeks.  Continue Mobic once a day  Encounter Diagnoses  Name Primary?  . S/P left knee arthroscopy 10/02/17 Yes  . Aftercare following surgery    Meds ordered this encounter  Medications  . DISCONTD: meloxicam (MOBIC) 7.5 MG tablet    Sig: Take 7.5 mg by mouth.  . meloxicam (MOBIC) 15 MG tablet    Sig: Take 1 tablet (15 mg total) daily by mouth.    Dispense:  30 tablet    Refill:  1

## 2017-10-13 ENCOUNTER — Telehealth: Payer: Self-pay | Admitting: Orthopedic Surgery

## 2017-10-13 NOTE — Telephone Encounter (Signed)
Clear /slightly bloody drainage from one portal site on Saturday, has resolved. Gave reassurance, encouraged patient to let us know immediately if he has any redness, purulent drainage, or warmth (he denies these). He will let us know if he has any further problems.   To you FYI

## 2017-10-13 NOTE — Telephone Encounter (Signed)
Patient called, left voice message for nurse/clinical staff to return call regarding question about his operative (left) knee, date of surgery 10/02/17. Ph# [phone]

## 2017-10-27 ENCOUNTER — Ambulatory Visit: Payer: Medicare Other | Admitting: Orthopedic Surgery

## 2017-11-04 ENCOUNTER — Ambulatory Visit (INDEPENDENT_AMBULATORY_CARE_PROVIDER_SITE_OTHER): Payer: Medicare Other | Admitting: Orthopedic Surgery

## 2017-11-04 ENCOUNTER — Encounter: Payer: Self-pay | Admitting: Orthopedic Surgery

## 2017-11-04 DIAGNOSIS — Z9889 Other specified postprocedural states: Secondary | ICD-10-CM

## 2017-11-04 MED ORDER — MELOXICAM 15 MG PO TABS
15.0000 mg | ORAL_TABLET | Freq: Every day | ORAL | 5 refills | Status: DC
Start: 2017-11-04 — End: 2018-06-23

## 2017-11-04 NOTE — Progress Notes (Signed)
POST OP VISIT   Patient ID: Peter Mora, male   DOB: May 24, 1946, 71 y.o.   MRN: [id number]  Chief Complaint  Patient presents with  . Follow-up    Post Barry Brunner DOS 10/02/17    Encounter Diagnosis  Name Primary?  . S/P left knee arthroscopy 10/02/17 Yes    Doing well, he is not having any difficulty; He does want his meloxicam refilled.  Full range of motion normal gait no limp no swelling no tenderness  Encounter Diagnosis  Name Primary?  . S/P left knee arthroscopy 10/02/17 Yes   Return as needed

## 2017-12-30 ENCOUNTER — Ambulatory Visit: Payer: Medicare Other | Admitting: Family Medicine

## 2018-01-01 ENCOUNTER — Ambulatory Visit (INDEPENDENT_AMBULATORY_CARE_PROVIDER_SITE_OTHER): Payer: Medicare Other | Admitting: Family Medicine

## 2018-01-01 ENCOUNTER — Encounter: Payer: Self-pay | Admitting: Family Medicine

## 2018-01-01 ENCOUNTER — Other Ambulatory Visit: Payer: Self-pay

## 2018-01-01 VITALS — BP 132/86 | HR 75 | Resp 16 | Ht 66.0 in | Wt 253.0 lb

## 2018-01-01 DIAGNOSIS — Z87891 Personal history of nicotine dependence: Secondary | ICD-10-CM | POA: Diagnosis not present

## 2018-01-01 DIAGNOSIS — E785 Hyperlipidemia, unspecified: Secondary | ICD-10-CM | POA: Diagnosis not present

## 2018-01-01 DIAGNOSIS — R0683 Snoring: Secondary | ICD-10-CM

## 2018-01-01 DIAGNOSIS — Z6841 Body Mass Index (BMI) 40.0 and over, adult: Secondary | ICD-10-CM | POA: Diagnosis not present

## 2018-01-01 DIAGNOSIS — K580 Irritable bowel syndrome with diarrhea: Secondary | ICD-10-CM | POA: Diagnosis not present

## 2018-01-01 DIAGNOSIS — Z1159 Encounter for screening for other viral diseases: Secondary | ICD-10-CM

## 2018-01-01 DIAGNOSIS — N400 Enlarged prostate without lower urinary tract symptoms: Secondary | ICD-10-CM | POA: Insufficient documentation

## 2018-01-01 DIAGNOSIS — K635 Polyp of colon: Secondary | ICD-10-CM | POA: Insufficient documentation

## 2018-01-01 DIAGNOSIS — K589 Irritable bowel syndrome without diarrhea: Secondary | ICD-10-CM | POA: Insufficient documentation

## 2018-01-01 DIAGNOSIS — D126 Benign neoplasm of colon, unspecified: Secondary | ICD-10-CM

## 2018-01-01 DIAGNOSIS — Z789 Other specified health status: Secondary | ICD-10-CM

## 2018-01-01 DIAGNOSIS — Z800 Family history of malignant neoplasm of digestive organs: Secondary | ICD-10-CM

## 2018-01-01 DIAGNOSIS — Z114 Encounter for screening for human immunodeficiency virus [HIV]: Secondary | ICD-10-CM | POA: Diagnosis not present

## 2018-01-01 DIAGNOSIS — R9431 Abnormal electrocardiogram [ECG] [EKG]: Secondary | ICD-10-CM | POA: Diagnosis not present

## 2018-01-01 NOTE — Progress Notes (Signed)
Chief Complaint  Patient presents with  . Establish Care    wants a colonoscopy, and to discuss possible gallbladder issues    72 year old gentleman who has decided he wants to update his medical care.  He has not seen a PCP in over 15 years.  No health screening.  No immunizations.  He has been good about getting a colonoscopy because he has 2 family members with colon cancer diagnoses, brother and sister.  His last colonoscopy was 5 years ago.  He does have a history of colon polyps.  He is not having any bowel problems, weight loss, blood in bowels. He states he normally has irritable bowel syndrome with several bowel movements a day.  He is uncertain whether he has any food intolerance or allergies.  This does not require him to take any medication or limit his diet. He acknowledges that he is overweight.  He states he has not been exercising.  Since he retired he has been inactive, eating and drinking more.  He has a goal to get healthy and get back down under 200 pounds.  He wants a complete checkup first, so that he can establish any medical issues before he does this. He is a heavy drinker by his own description.  He states that he drinks to the point of "a buzz" every day.  He drinks both beer and whiskey.  We discussed the medical recommendations for limiting alcohol to 2 beverages a day.  Discussed health risks of heavy alcohol.  He denies any problems with his job, legal issues, problems with his relationship with his wife because of the alcohol. 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  Specific testing discussed, he snores and his wife has to sleep in a separate room.  He would like to get this addressed.  He has been told he has sleep apnea.  Sleep study will be ordered He needs a referral to gastroenterology in order to get another colonoscopy. He was a previous smoker.  He is  never had AAA screening.  I have ordered an ultrasound He needs blood work.  I will do the standard metabolic panel and lipids.  He does not know if he is ever had his cholesterol checked.  He would also like to have a PSA for prostate cancer.  Pros and cons of PSA testing are discussed.  He states he has been told in the past that his prostate is enlarged.  He is never had difficulty with urinary symptoms.  He is also interested in screening for hepatitis C and HIV per CDC recommendations. Meniscectomy was performed by Dr. Aline Brochure last year.  He is doing very well.  He had an abnormal EKG and was evaluated by cardiology.  His echocardiogram was normal.  He had no surgical complications.  He has no history of heart disease chest pain or noted irregular heartbeats.  He did have PVCs on EKG.  Patient Active Problem List   Diagnosis Date Noted  . Snoring 01/01/2018  . Colon polyp 01/01/2018  . Family history of colon cancer 01/01/2018  . Heavy drinker of alcohol 01/01/2018  . Irritable bowel syndrome (IBS) 01/01/2018  . Body mass index (BMI) of 40.0-44.9 in adult ([place]) 01/01/2018  . Abnormal EKG 01/01/2018  . S/P left knee arthroscopy 10/02/17 10/06/2017  . Seborrheic keratosis 07/25/2017    Outpatient Encounter Medications as of 01/01/2018  Medication Sig  . hydrocortisone cream 1 % Apply 1 application topically See admin instructions. Apply to scalp daily after shower  . meloxicam (MOBIC) 15 MG tablet Take 1 tablet (15 mg total) by mouth daily.  . [DISCONTINUED] HYDROcodone-acetaminophen (NORCO/VICODIN) 5-325 MG tablet Take 1 tablet every 4 (four) hours as needed by mouth for moderate pain. (Patient not taking: Reported on 11/04/2017)   No facility-administered encounter medications on file as of 01/01/2018.     Past Medical History:  Diagnosis Date  . Arthritis   . Sleep apnea     Past Surgical History:  Procedure Laterality Date  . KNEE ARTHROSCOPY WITH MEDIAL MENISECTOMY Left  10/02/2017   Procedure: LEFT KNEE ARTHROSCOPY WITH PARTIAL MEDIAL MENISECTOMY;  Surgeon: Carole Civil, MD;  Location: [facility];  Service: Orthopedics;  Laterality: Left;    Social History   Socioeconomic History  . Marital status: Married    Spouse name: [name]  . Number of children: 3  . Years of education: 16  . Highest education level: Bachelor's degree (e.g., BA, AB, BS)  Social Needs  . Financial resource strain: Not on file  . Food insecurity - worry: Not on file  . Food insecurity - inability: Not on file  . Transportation needs - medical: Not on file  . Transportation needs - non-medical: Not on file  Occupational History  . Occupation: woodworker    Comment: retired  . 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 Smoking status: Former Smoker    Years: 35.00    Types: Cigarettes    Last attempt to quit: 09/15/2002    Years since quitting: 15.3  . Smokeless tobacco: Never Used  Substance and Sexual Activity  . Alcohol use: Yes    Alcohol/week: 25.2 oz    Types: 21 Cans of beer, 21 Shots of liquor per week  . Drug use: No  . Sexual activity: Not Currently    Birth control/protection: None  Other Topics Concern  . Not on file  Social History Narrative   Lives with second wife [name], [place] woman   She travels a lot   Retired Armed forces technical officer   No reg exercise    Family History  Problem Relation Age of Onset  . Cancer Mother        esophageal  . Cancer Sister        colon and breast   . Cancer Brother        colon    Review of Systems  Constitutional: Negative for chills, fever and weight loss.  HENT: Negative for congestion and hearing loss.   Eyes: Negative for blurred vision and pain.  Respiratory: Negative for cough and shortness of breath.   Cardiovascular: Negative for chest pain and leg swelling.  Gastrointestinal: Negative for abdominal pain, constipation, diarrhea and heartburn.  Genitourinary: Negative for dysuria  and frequency.  Musculoskeletal: Negative for falls, joint pain and myalgias.  Neurological: Negative for dizziness, seizures and headaches.  Psychiatric/Behavioral: Negative for depression. The patient is not nervous/anxious and does not have insomnia.     BP 132/86   Pulse 75   Resp 16   Ht 5\' 6"  (1.676 m)   Wt 253 lb (114.8 kg)   SpO2 95%   BMI 40.84 kg/m   Physical Exam  Constitutional: He is oriented to person, place, and time. He appears well-developed and well-nourished.  Obese  HENT:  Head: Normocephalic and atraumatic.  Mouth/Throat: Oropharynx is clear and moist.  Eyes: Conjunctivae are  normal. Pupils are equal, round, and reactive to light.  Neck: Normal range of motion. Neck supple. No thyromegaly present.  Cardiovascular: Normal rate, regular rhythm and normal heart sounds.  Pulmonary/Chest: Effort normal and breath sounds normal. No respiratory distress.  Abdominal:  Obese  Musculoskeletal: Normal range of motion. He exhibits no edema.  Lymphadenopathy:    He has no cervical adenopathy.  Neurological: He is alert and oriented to person, place, and time.  Gait normal  Skin: Skin is warm and dry.  Psychiatric: He has a normal mood and affect. His behavior is normal. Thought content normal.  Emotional lability discussing death of his brother  Nursing note and vitals reviewed. ASSESSMENT/PLAN:   1. Snoring By report.  He feels he has sleep apnea.  This was suggested to him by the anesthesiologist when he had his surgery. - Nocturnal polysomnography (NPSG); Future  2. Adenomatous polyp of colon, unspecified part of colon History of polyps.  Family history of cancer.  Due for colonoscopy - Ambulatory referral to Gastroenterology  3. Family history of colon cancer As above - Ambulatory referral to Gastroenterology  4. Heavy drinker of alcohol Discussed need to reduce and health consequences.  5. Former smoker, stopped smoking many years ago Discussed need  for aortic aneurysm screening - COMPLETE METABOLIC PANEL WITH GFR - Lipid panel - Urinalysis, Routine w reflex microscopic - VAS [place] AAA DUPLEX; Future  6. Irritable bowel syndrome with diarrhea Chronic.  Patient does not believe this needs workup or management.  He is accustomed to having several bowel movements a day.  7. Encounter for hepatitis C screening test for low risk patient Per recommendations for baby boomers - Hepatitis C antibody  8. Encounter for screening for HIV Patient request - HIV antibody  9. Prostate enlargement Patient request after discussion - PSA  10. Dyslipidemia Vague history of cholesterol in the past.  He thinks that it got better.  We will check - Lipid panel  11. Body mass index (BMI) of 40.0-44.9 in adult [facility]) Obesity discussed.  Diet and exercise recommended  12. Abnormal EKG Workup with cardiology revealed no active heart disease  Greater than 50% of this visit was spent in counseling and coordinating care.  Total face to face time: 60 minutes in reviewing extensive health history, health recommendations, review of old records, and scheduling of follow-up and testing   Patient Instructions  It was a pleasure meeting you today. Come back for a physical in a couple weeks. Today I have ordered lab work I have ordered a screening ultrasound of your abdominal aorta I have ordered a sleep study I have ordered a referral for a colonoscopy I will notify you as soon as I have your test results.  If you have my chart I will release them to an email, otherwise I will send a letter Please call if there is any questions or problems.   [name], MD

## 2018-01-01 NOTE — Patient Instructions (Signed)
It was a pleasure meeting you today. Come back for a physical in a couple weeks. Today I have ordered lab work I have ordered a screening ultrasound of your abdominal aorta I have ordered a sleep study I have ordered a referral for a colonoscopy I will notify you as soon as I have your test results.  If you have my chart I will release them to an email, otherwise I will send a letter Please call if there is any questions or problems.

## 2018-01-06 ENCOUNTER — Encounter: Payer: Self-pay | Admitting: Gastroenterology

## 2018-01-13 ENCOUNTER — Ambulatory Visit: Payer: Medicare Other | Attending: Family Medicine | Admitting: Neurology

## 2018-01-13 DIAGNOSIS — G4733 Obstructive sleep apnea (adult) (pediatric): Secondary | ICD-10-CM | POA: Diagnosis not present

## 2018-01-13 DIAGNOSIS — Z791 Long term (current) use of non-steroidal anti-inflammatories (NSAID): Secondary | ICD-10-CM | POA: Insufficient documentation

## 2018-01-13 DIAGNOSIS — R0683 Snoring: Secondary | ICD-10-CM

## 2018-01-14 ENCOUNTER — Encounter: Payer: Self-pay | Admitting: Family Medicine

## 2018-01-14 ENCOUNTER — Other Ambulatory Visit: Payer: Self-pay | Admitting: Family Medicine

## 2018-01-14 DIAGNOSIS — G4733 Obstructive sleep apnea (adult) (pediatric): Secondary | ICD-10-CM

## 2018-01-14 NOTE — Procedures (Signed)
    [place] A. Merlene Laughter, MD     [email or web address]             NOCTURNAL POLYSOMNOGRAPHY   LOCATION: [facility]   Patient Name: Peter, Mora Date: 01/13/2018 Gender: Male D.O.B: 1946/04/01 Age (years): 71 Referring Provider: [name] Height (inches): 66 Interpreting Physician: Phillips Odor MD, ABSM Weight (lbs): 253 RPSGT: Rosebud Poles BMI: 41 MRN: [id number] Neck Size: 19.00 CLINICAL INFORMATION Sleep Study Type: NPSG     Indication for sleep study: Snoring     Epworth Sleepiness Score: 1     SLEEP STUDY TECHNIQUE As per the AASM Manual for the Scoring of Sleep and Associated Events v2.3 (April 2016) with a hypopnea requiring 4% desaturations.  The channels recorded and monitored were frontal, central and occipital EEG, electrooculogram (EOG), submentalis EMG (chin), nasal and oral airflow, thoracic and abdominal wall motion, anterior tibialis EMG, snore microphone, electrocardiogram, and pulse oximetry.  MEDICATIONS Medications self-administered by patient taken the night of the study : N/A  Current Outpatient Medications:  .  hydrocortisone cream 1 %, Apply 1 application topically See admin instructions. Apply to scalp daily after shower, Disp: , Rfl:  .  meloxicam (MOBIC) 15 MG tablet, Take 1 tablet (15 mg total) by mouth daily., Disp: 30 tablet, Rfl: 5     SLEEP ARCHITECTURE The study was initiated at 10:20:17 PM and ended at 4:50:09 AM.  Sleep onset time was 37.2 minutes and the sleep efficiency was 72.5%%. The total sleep time was 282.6 minutes.  Stage REM latency was 177.0 minutes.  The patient spent 7.8%% of the night in stage N1 sleep, 76.8%% in stage N2 sleep, 8.7%% in stage N3 and 6.72% in REM.  Alpha intrusion was absent.  Supine sleep was 5.31%.  RESPIRATORY PARAMETERS The overall apnea/hypopnea index (AHI) was 12 per hour. There were 9 total apneas, including 9 obstructive, 0 central and 0  mixed apneas. There were 46 hypopneas and 0 RERAs.  The AHI during Stage REM sleep was 25.3 per hour.  AHI while supine was 16.0 per hour.  The mean oxygen saturation was 91.3%. The minimum SpO2 during sleep was 81.0%.  loud snoring was noted during this study.  CARDIAC DATA The 2 lead EKG demonstrated sinus rhythm. The mean heart rate was N/A beats per minute. Other EKG findings include: None. LEG MOVEMENT DATA The total PLMS were 9 with a resulting PLMS index of 1.9. Associated arousal with leg movement index was 0.0.   IMPRESSIONS Mild obstructive sleep apnea is documented during this recording. A formal CPAP titration study is recommended.  Delano Metz, MD Diplomate, American Board of Sleep Medicine. ELECTRONICALLY SIGNED ON:  01/14/2018, 1:17 PM [place] PH: [phone]   FX: [phone] [place]

## 2018-02-02 ENCOUNTER — Other Ambulatory Visit: Payer: Self-pay

## 2018-02-02 ENCOUNTER — Ambulatory Visit (INDEPENDENT_AMBULATORY_CARE_PROVIDER_SITE_OTHER): Payer: Medicare Other | Admitting: Family Medicine

## 2018-02-02 ENCOUNTER — Encounter: Payer: Self-pay | Admitting: Family Medicine

## 2018-02-02 VITALS — BP 124/78 | HR 84 | Temp 99.6°F | Ht 66.0 in | Wt 256.0 lb

## 2018-02-02 DIAGNOSIS — R1011 Right upper quadrant pain: Secondary | ICD-10-CM

## 2018-02-02 DIAGNOSIS — D126 Benign neoplasm of colon, unspecified: Secondary | ICD-10-CM

## 2018-02-02 DIAGNOSIS — L989 Disorder of the skin and subcutaneous tissue, unspecified: Secondary | ICD-10-CM

## 2018-02-02 DIAGNOSIS — G8929 Other chronic pain: Secondary | ICD-10-CM

## 2018-02-02 DIAGNOSIS — Z114 Encounter for screening for human immunodeficiency virus [HIV]: Secondary | ICD-10-CM | POA: Diagnosis not present

## 2018-02-02 DIAGNOSIS — Z136 Encounter for screening for cardiovascular disorders: Secondary | ICD-10-CM

## 2018-02-02 DIAGNOSIS — G4733 Obstructive sleep apnea (adult) (pediatric): Secondary | ICD-10-CM | POA: Diagnosis not present

## 2018-02-02 DIAGNOSIS — Z87891 Personal history of nicotine dependence: Secondary | ICD-10-CM | POA: Diagnosis not present

## 2018-02-02 DIAGNOSIS — N400 Enlarged prostate without lower urinary tract symptoms: Secondary | ICD-10-CM | POA: Diagnosis not present

## 2018-02-02 DIAGNOSIS — Z0000 Encounter for general adult medical examination without abnormal findings: Secondary | ICD-10-CM

## 2018-02-02 DIAGNOSIS — Z1159 Encounter for screening for other viral diseases: Secondary | ICD-10-CM | POA: Diagnosis not present

## 2018-02-02 DIAGNOSIS — Z800 Family history of malignant neoplasm of digestive organs: Secondary | ICD-10-CM | POA: Diagnosis not present

## 2018-02-02 DIAGNOSIS — E785 Hyperlipidemia, unspecified: Secondary | ICD-10-CM | POA: Diagnosis not present

## 2018-02-02 NOTE — Patient Instructions (Addendum)
You need lab work today. We will order an ultrasound to check your gallbladder and your aorta We are going to see the gastroenterologist about colonoscopy and hemorrhoids I have ordered a dermatology referral for the skin discoloration on your back  See me back in 4 to 5 weeks when these tests are complete

## 2018-02-02 NOTE — Progress Notes (Signed)
Chief Complaint  Patient presents with  . Annual Exam   Eye exam q 2 y Dental exams q30mo Still waiting for US abdomen Still waiting for CPAP titration Has appt scheduled with Dr fields for colonoscopy Also c/o hemorrhoids Has a skin lesion L upper back  Is wanting a "clear report" from his health testing PRIOR to changing his habits for healthy lifestyle - continues to drink heavily, not diet or exercise for now   Patient Active Problem List   Diagnosis Date Noted  . Snoring 01/01/2018  . Colon polyp 01/01/2018  . Family history of colon cancer 01/01/2018  . Heavy drinker of alcohol 01/01/2018  . Irritable bowel syndrome (IBS) 01/01/2018  . Body mass index (BMI) of 40.0-44.9 in adult ([place]) 01/01/2018  . Abnormal EKG 01/01/2018  . S/P left knee arthroscopy 10/02/17 10/06/2017  . Seborrheic keratosis 07/25/2017    Outpatient Encounter Medications as of 02/02/2018  Medication Sig  . hydrocortisone cream 1 % Apply 1 application topically See admin instructions. Apply to scalp daily after shower  . meloxicam (MOBIC) 15 MG tablet Take 1 tablet (15 mg total) by mouth daily.   No facility-administered encounter medications on file as of 02/02/2018.     No Known Allergies  Review of Systems  Constitutional: Positive for unexpected weight change. Negative for activity change and appetite change.       Gain  HENT: Negative for congestion, dental problem and hearing loss.   Eyes: Negative for photophobia and visual disturbance.  Respiratory: Negative for cough and shortness of breath.   Cardiovascular: Positive for leg swelling. Negative for chest pain and palpitations.       "sock line" at ankles  Gastrointestinal: Positive for diarrhea. Negative for blood in stool, constipation, nausea and vomiting.  Genitourinary: Negative for difficulty urinating and frequency.  Musculoskeletal: Negative for arthralgias, back pain and gait problem.  Skin: Positive for color change.    Neurological: Negative for dizziness and light-headedness.  Psychiatric/Behavioral: Negative for dysphoric mood. The patient is not hyperactive.     BP 124/78   Pulse 84   Temp 99.6 F (37.6 C)   Ht 5\' 6"  (1.676 m)   Wt 256 lb 0.6 oz (116.1 kg)   SpO2 96%   BMI 41.33 kg/m   Physical Exam  BP 124/78   Pulse 84   Temp 99.6 F (37.6 C)   Ht 5\' 6"  (1.676 m)   Wt 256 lb 0.6 oz (116.1 kg)   SpO2 96%   BMI 41.33 kg/m   General Appearance:    Alert, cooperative, no distress, appears stated age  Head:    Normocephalic, without obvious abnormality, atraumatic  Eyes:    PERRL, conjunctiva/corneas clear, EOM's intact, fundi    benign, both eyes       Ears:    Normal TM's and external ear canals, both ears  Nose:   Nares normal, septum midline, mucosa normal, no drainage   or sinus tenderness  Throat:   Lips, mucosa, and tongue normal; teeth and gums normal  Neck:   Supple, symmetrical, trachea midline, no adenopathy;       thyroid:  No enlargement/tenderness/nodules; no carotid   bruit   Back:     Symmetric, no curvature, ROM normal, no CVA tenderness  Lungs:     Clear to auscultation bilaterally, respirations unlabored  Chest wall:    No tenderness or deformity  Heart:    Regular rate and rhythm, S1 and S2 normal, no  murmur, rub   or gallop  Abdomen:     Soft obese, non-tender, bowel sounds active all four quadrants,    no masses, no organomegaly  Genitalia:    Normal male without lesion, discharge or tenderness  Extremities:   Extremities normal, atraumatic, no cyanosis or edema  Pulses:   2+ and symmetric all extremities  Skin:   Skin color, texture, turgor normal, no rashes, upper left back/above shoulder blade there is an irregularly-shaped large 4 x 6 cm lesion with irregular pigmentation and some scaling.  Lymph nodes:   Cervical, supraclavicular, and axillary nodes normal  Neurologic:   CNII-XII intact. Normal strength, sensation and reflexes      throughout      ASSESSMENT/PLAN: PE (physical exam), annual 1. Adenomatous polyp of colon, unspecified part of colon - Ambulatory referral to Gastroenterology - Ambulatory referral to Dermatology  2. Chronic RUQ pain - US Abdomen Complete; Future  3. Screening for AAA (abdominal aortic aneurysm) - US Abdomen Complete; Future  Skin lesion of back Ambulatory referral to Dermatology  OSA (obstructive sleep apnea) Referred for titration study   Patient Instructions  You need lab work today. We will order an ultrasound to check your gallbladder and your aorta We are going to see the gastroenterologist about colonoscopy and hemorrhoids I have ordered a dermatology referral for the skin discoloration on your back  See me back in 4 to 5 weeks when these tests are complete   [name], MD

## 2018-02-03 ENCOUNTER — Encounter: Payer: Medicare Other | Admitting: Family Medicine

## 2018-02-03 ENCOUNTER — Encounter: Payer: Self-pay | Admitting: Family Medicine

## 2018-02-03 LAB — URINALYSIS, ROUTINE W REFLEX MICROSCOPIC
Bilirubin Urine: NEGATIVE
GLUCOSE, UA: NEGATIVE
Hgb urine dipstick: NEGATIVE
Ketones, ur: NEGATIVE
LEUKOCYTES UA: NEGATIVE
Nitrite: NEGATIVE
PROTEIN: NEGATIVE
SPECIFIC GRAVITY, URINE: 1.019 (ref 1.001–1.03)
pH: 7 (ref 5.0–8.0)

## 2018-02-03 LAB — COMPLETE METABOLIC PANEL WITH GFR
AG RATIO: 1.8 (calc) (ref 1.0–2.5)
ALKALINE PHOSPHATASE (APISO): 69 U/L (ref 40–115)
ALT: 36 U/L (ref 9–46)
AST: 27 U/L (ref 10–35)
Albumin: 4.3 g/dL (ref 3.6–5.1)
BILIRUBIN TOTAL: 0.9 mg/dL (ref 0.2–1.2)
BUN: 21 mg/dL (ref 7–25)
CHLORIDE: 105 mmol/L (ref 98–110)
CO2: 28 mmol/L (ref 20–32)
Calcium: 8.9 mg/dL (ref 8.6–10.3)
Creat: 0.95 mg/dL (ref 0.70–1.25)
GFR, EST AFRICAN AMERICAN: 94 mL/min/{1.73_m2} (ref >=60)
GFR, Est Non African American: 81 mL/min/{1.73_m2} (ref >=60)
GLUCOSE: 92 mg/dL (ref 65–99)
Globulin: 2.4 g/dL (calc) (ref 1.9–3.7)
POTASSIUM: 4.7 mmol/L (ref 3.5–5.3)
Sodium: 138 mmol/L (ref 135–146)
TOTAL PROTEIN: 6.7 g/dL (ref 6.1–8.1)

## 2018-02-03 LAB — LIPID PANEL
Cholesterol: 186 mg/dL (ref <200)
HDL: 65 mg/dL (ref >40)
LDL Cholesterol (Calc): 105 mg/dL (calc) — ABNORMAL HIGH
NON-HDL CHOLESTEROL (CALC): 121 mg/dL (ref <130)
TRIGLYCERIDES: 74 mg/dL (ref <150)
Total CHOL/HDL Ratio: 2.9 (calc) (ref <5.0)

## 2018-02-03 LAB — HIV ANTIBODY (ROUTINE TESTING W REFLEX): HIV: NONREACTIVE

## 2018-02-03 LAB — HEPATITIS C ANTIBODY
HEP C AB: NONREACTIVE
SIGNAL TO CUT-OFF: 0.01 (ref <1.00)

## 2018-02-03 LAB — PSA: PSA: 0.4 ng/mL (ref <=4.0)

## 2018-02-05 DIAGNOSIS — L821 Other seborrheic keratosis: Secondary | ICD-10-CM | POA: Diagnosis not present

## 2018-02-05 DIAGNOSIS — C4359 Malignant melanoma of other part of trunk: Secondary | ICD-10-CM | POA: Diagnosis not present

## 2018-02-05 DIAGNOSIS — D485 Neoplasm of uncertain behavior of skin: Secondary | ICD-10-CM | POA: Diagnosis not present

## 2018-02-05 DIAGNOSIS — L853 Xerosis cutis: Secondary | ICD-10-CM | POA: Diagnosis not present

## 2018-02-05 DIAGNOSIS — L570 Actinic keratosis: Secondary | ICD-10-CM | POA: Diagnosis not present

## 2018-02-05 DIAGNOSIS — L578 Other skin changes due to chronic exposure to nonionizing radiation: Secondary | ICD-10-CM | POA: Diagnosis not present

## 2018-02-06 ENCOUNTER — Ambulatory Visit (HOSPITAL_COMMUNITY)
Admission: RE | Admit: 2018-02-06 | Discharge: 2018-02-06 | Disposition: A | Discharge status: Home / Self Care | Payer: Medicare Other | Source: Ambulatory Visit | Attending: Family Medicine | Admitting: Family Medicine

## 2018-02-06 ENCOUNTER — Telehealth: Payer: Self-pay | Admitting: Family Medicine

## 2018-02-06 DIAGNOSIS — R1011 Right upper quadrant pain: Secondary | ICD-10-CM

## 2018-02-06 DIAGNOSIS — G8929 Other chronic pain: Secondary | ICD-10-CM

## 2018-02-06 DIAGNOSIS — I714 Abdominal aortic aneurysm, without rupture: Secondary | ICD-10-CM | POA: Diagnosis not present

## 2018-02-06 DIAGNOSIS — Z136 Encounter for screening for cardiovascular disorders: Secondary | ICD-10-CM

## 2018-02-06 DIAGNOSIS — K760 Fatty (change of) liver, not elsewhere classified: Secondary | ICD-10-CM | POA: Insufficient documentation

## 2018-02-06 NOTE — Telephone Encounter (Signed)
Called patient regarding message below. No answer, unable to leave message.  

## 2018-02-06 NOTE — Telephone Encounter (Signed)
His sleep study recommended a CPAP titration study.  I ordered a CPAP titration study.  I have no idea if it is an overnight stay.

## 2018-02-06 NOTE — Telephone Encounter (Signed)
Patient left voicemail stating that he received a call that he had to schedule an over night for his sleep study. He states Dr.Nelson said he doesn't have to stay over night. He wants to speak to someone.

## 2018-02-06 NOTE — Telephone Encounter (Signed)
It look like he had a sleep study in Feb that way completed. Is he supposed to be doing another?

## 2018-02-09 ENCOUNTER — Telehealth: Payer: Self-pay | Admitting: Family Medicine

## 2018-02-09 NOTE — Telephone Encounter (Signed)
Pt called and asked should he stay overnight for the CPap fitting.  His sleep study is done and now just needs fitting.  Per Velna Hatchet he should not have to stay overnight. He will call them back

## 2018-02-10 ENCOUNTER — Telehealth: Payer: Self-pay | Admitting: Family Medicine

## 2018-02-10 NOTE — Telephone Encounter (Signed)
Called patient regarding message below. No answer, unable to leave message.  

## 2018-02-10 NOTE — Telephone Encounter (Signed)
Patient wants to talk with you directly about his sleep study and the follow up.  Please call him Wed am.

## 2018-02-11 ENCOUNTER — Encounter: Payer: Self-pay | Admitting: Family Medicine

## 2018-02-12 NOTE — Telephone Encounter (Signed)
Left message.  YSN

## 2018-03-03 ENCOUNTER — Telehealth: Payer: Self-pay | Admitting: Orthopedic Surgery

## 2018-03-03 NOTE — Telephone Encounter (Signed)
Patient called and asked if he can speak with Dr Aline Brochure, or if best to schedule appointment. Offered to schedule, but would like to speak with Dr / clinical staff..  States since surgery of 10/02/17, and since last visit, 11/04/17, it had been doing better, and now said that his left knee "has plateaued".  He has elected to cut the dosage in half of medication, and has noticed that knee has not felt as good since:   meloxicam (MOBIC) 15 MG tablet 30 tablet   Patient's ph# is [phone].

## 2018-03-04 NOTE — Telephone Encounter (Signed)
Ok go back to the previous dosing

## 2018-03-04 NOTE — Telephone Encounter (Signed)
I spoke to patient to advise.

## 2018-03-05 ENCOUNTER — Encounter: Payer: Self-pay | Admitting: Gastroenterology

## 2018-03-05 ENCOUNTER — Telehealth: Payer: Self-pay

## 2018-03-05 ENCOUNTER — Other Ambulatory Visit: Payer: Self-pay

## 2018-03-05 ENCOUNTER — Ambulatory Visit (INDEPENDENT_AMBULATORY_CARE_PROVIDER_SITE_OTHER): Payer: Medicare Other | Admitting: Gastroenterology

## 2018-03-05 DIAGNOSIS — G4733 Obstructive sleep apnea (adult) (pediatric): Secondary | ICD-10-CM

## 2018-03-05 DIAGNOSIS — R194 Change in bowel habit: Secondary | ICD-10-CM

## 2018-03-05 DIAGNOSIS — K22719 Barrett's esophagus with dysplasia, unspecified: Secondary | ICD-10-CM

## 2018-03-05 DIAGNOSIS — K580 Irritable bowel syndrome with diarrhea: Secondary | ICD-10-CM

## 2018-03-05 MED ORDER — NA SULFATE-K SULFATE-MG SULF 17.5-3.13-1.6 GM/177ML PO SOLN: 1.0000 | ORAL | 0 refills | Status: DC

## 2018-03-05 NOTE — Assessment & Plan Note (Signed)
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 SYMPTOMS NOT CONTROLLED.  PT DECLINED RX FOR ANTMOTILITY DRUGS. HE DOESN'T LIKE TO TAKE MEDICINE. DRINK WATER TO KEEP YOUR URINE LIGHT YELLOW. COMPLETE COLONOSCOPY IN 3-4 WEEKS WITH PROPOFOL TO BIOPSY COLON DUE TO DAILY ETOH USE.ANXIETY. YOU MAY BRING THE ENEMA TO ADMINISTER IN THE PREOP AREA. DISCUSSED PROCEDURE, BENEFITS, & RISKS: < 1% chance of medication reaction, bleeding, perforation, or rupture of spleen/liver. FOLLOW A CLEAR LIQUID DIET ON DAY BEFORE BUT YOU CAN HAVE A SMALL PLATE OF MASHED POTATOES OR RICE IF YOU GET HUNGRY, ONE SERVING OF EITHER. DO NOT USE A MINERAL OIL ENEMA OR CHICKEN BROTH WITH OIL DROPLETS IN IT. FOLLOW UP IN THE OFFICE WILL BE SCHEDULED IF NEEDED AFTER ENDOSCOPY.

## 2018-03-05 NOTE — Progress Notes (Addendum)
   Subjective:    Patient ID: Peter Mora, male    DOB: [birth year], 72 y.o.   MRN: [id number] [name], MD  HPI IBS ALL HIS LIFE. USUALLY 4-6 TIMES A DAY(#5-6). HAD SURGERY. PLACED ON MELOXICAM AND DEVELOPED DIARRHEA AND NOW GOING 20-25 TIMES A DAY(#5-7.  TWO REASONS: 1. CONTROL OF IBS 2. WANTS A COLONOSCOPY. NEVER HAD EGD. DOESN'T WEAR C PAP MACHINE AT NIGHT. WENT TO SLEEP LAB.  PT DENIES FEVER, CHILLS, HEMATOCHEZIA, HEMATEMESIS, nausea, vomiting, melena, CHEST PAIN, SHORTNESS OF BREATH, constipation, abdominal pain, problems swallowing, problems with sedation, OR heartburn or indigestion.   Past Medical History:  Diagnosis Date  . Arthritis   . Sleep apnea    Past Surgical History:  Procedure Laterality Date  . KNEE ARTHROSCOPY WITH MEDIAL MENISECTOMY Left 10/02/2017   Procedure: LEFT KNEE ARTHROSCOPY WITH PARTIAL MEDIAL MENISECTOMY;  Surgeon: Carole Civil, MD;  Location: [facility];  Service: Orthopedics;  Laterality: Left;   No Known Allergies   Current Outpatient Medications  Medication Sig Dispense Refill  . hydrocortisone cream 1 % Apply 1 application topically See admin instructions. Apply to scalp daily after shower    . meloxicam (MOBIC) 15 MG tablet Take 1 tablet (15 mg total) by mouth daily. 30 tablet 5   No current facility-administered medications for this visit.    Family History  Problem Relation Age of Onset  . Cancer Mother        esophageal  . Cancer Sister        colon and breast   . Cancer Brother        colon  . Cancer Father 31       agressive lung cancer   Social History   Tobacco Use  . Smoking status: Former Smoker    Years: 35.00    Types: Cigarettes    Last attempt to quit: 09/15/2002    Years since quitting: 15.4  . Smokeless tobacco: Never Used  Substance Use Topics  . Alcohol use: Yes    Alcohol/week: 25.2 oz    Types: 21 Cans of beer, 21 Shots of liquor per week    Comment: 6 drinks per day  . Drug use: No      Review of Systems PER HPI OTHERWISE ALL SYSTEMS ARE NEGATIVE.     Objective:   Physical Exam  Constitutional: He is oriented to person, place, and time. He appears well-developed and well-nourished. No distress.  HENT:  Head: Normocephalic and atraumatic.  Mouth/Throat: Oropharynx is clear and moist. No oropharyngeal exudate.  Eyes: Pupils are equal, round, and reactive to light. No scleral icterus.  Neck: Normal range of motion. Neck supple.  Cardiovascular: Normal rate, regular rhythm and normal heart sounds.  Pulmonary/Chest: Effort normal and breath sounds normal. No respiratory distress.  Abdominal: Soft. Bowel sounds are normal. He exhibits no distension. There is no tenderness.  Musculoskeletal: He exhibits edema (1+ BIL LEs).  Lymphadenopathy:    He has no cervical adenopathy.  Neurological: He is alert and oriented to person, place, and time.  NO FOCAL DEFICITS  Psychiatric: He has a normal mood and affect.  Vitals reviewed.     Assessment & Plan:

## 2018-03-05 NOTE — Telephone Encounter (Signed)
Tried to call pt to inform of pre-op appt 04/02/18 at 10:00am, no answer, LMOVM. Letter mailed.

## 2018-03-05 NOTE — Assessment & Plan Note (Signed)
DOUBT PT HAS IBS BECAUSE HE NEVER HAS ABDOMINAL PAIN. DIFFERENTIAL DIAGNOSIS INCLUDES: MICROSCOPIC COLITIS OR CELIAC SPRUE.  EGD IN 4 WEEKS TO SCREEN FOR BARRETT'S ESOPHAGUS AND WILL BIOPSY DUODENUM. DISCUSSED PROCEDURE, BENEFITS, & RISKS: < 1% chance of medication reaction, bleeding, OR perforation.

## 2018-03-05 NOTE — Patient Instructions (Addendum)
DRINK WATER TO KEEP YOUR URINE LIGHT YELLOW.   COMPLETE COLONOSCOPY AND UPPER ENDOSCOPY IN 3-4 WEEKS WITH PROPOFOL. YOU MAY BRING THE ENEMA TO ADMINISTER IN THE PREOP AREA.  FOLLOW A CLEAR LIQUID DIET ON DAY BEFORE BUT YOU CAN HAVE A SMALL PLATE OF MASHED POTATOES OR RICE IF YOU GET HUNGRY, ONE SERVING OF EITHER. DO NOT USE A MINERAL OIL ENEMA OR CHICKEN BROTH WITH OIL DROPLETS IN IT.  FOLLOW UP IN THE OFFICE WILL BE SCHEDULED IF NEEDED AFTER ENDOSCOPY.

## 2018-03-05 NOTE — Assessment & Plan Note (Signed)
MILD. DOESN'T WEAR A MASK. INCOMPLETE SLEEP STUDY.

## 2018-03-05 NOTE — Progress Notes (Signed)
cc'ed to pcp °

## 2018-03-10 DIAGNOSIS — Z85820 Personal history of malignant melanoma of skin: Secondary | ICD-10-CM | POA: Diagnosis not present

## 2018-03-10 DIAGNOSIS — C4361 Malignant melanoma of right upper limb, including shoulder: Secondary | ICD-10-CM | POA: Diagnosis not present

## 2018-03-10 DIAGNOSIS — C4359 Malignant melanoma of other part of trunk: Secondary | ICD-10-CM | POA: Diagnosis not present

## 2018-03-31 NOTE — Patient Instructions (Addendum)
Peter Mora  03/31/2018     @PREFPERIOPPHARMACY @   Your procedure is scheduled on 04/07/2018.  Report to [facility] at 11:15 A.M.  Call this number if you have problems the morning of surgery:  [phone]   Remember:  Do not eat food or drink liquids after midnight.  Take these medicines the morning of surgery with A SIP OF WATER : none   Do not wear jewelry, make-up or nail polish.  Do not wear lotions, powders, or perfumes, or deodorant.  Do not shave 48 hours prior to surgery.  Men may shave face and neck.  Do not bring valuables to the hospital.  [facility] is not responsible for any belongings or valuables.  Contacts, dentures or bridgework may not be worn into surgery.  Leave your suitcase in the car.  After surgery it may be brought to your room.  For patients admitted to the hospital, discharge time will be determined by your treatment team.  Patients discharged the day of surgery will not be allowed to drive home.   Name and phone number of your driver:   family Special instructions:  n/a  Please read over the following fact sheets that you were given. Care and Recovery After Surgery       Esophagogastroduodenoscopy Esophagogastroduodenoscopy (EGD) is a procedure to examine the lining of the esophagus, stomach, and first part of the small intestine (duodenum). This procedure is done to check for problems such as inflammation, bleeding, ulcers, or growths. During this procedure, a long, flexible, lighted tube with a camera attached (endoscope) is inserted down the throat. Tell a health care provider about:  Any allergies you have.  All medicines you are taking, including vitamins, herbs, eye drops, creams, and over-the-counter medicines.  Any problems you or family members have had with anesthetic medicines.  Any blood disorders you have.  Any surgeries you have had.  Any medical conditions you have.  Whether you are pregnant or may be  pregnant. What are the risks? Generally, this is a safe procedure. However, problems may occur, including:  Infection.  Bleeding.  A tear (perforation) in the esophagus, stomach, or duodenum.  Trouble breathing.  Excessive sweating.  Spasms of the larynx.  A slowed heartbeat.  Low blood pressure.  What happens before the procedure?  Follow instructions from your health care provider about eating or drinking restrictions.  Ask your health care provider about: ? Changing or stopping your regular medicines. This is especially important if you are taking diabetes medicines or blood thinners. ? Taking medicines such as aspirin and ibuprofen. These medicines can thin your blood. Do not take these medicines before your procedure if your health care provider instructs you not to.  Plan to have someone take you home after the procedure.  If you wear dentures, be ready to remove them before the procedure. What happens during the procedure?  To reduce your risk of infection, your health care team will wash or sanitize their hands.  An IV tube will be put in a vein in your hand or arm. You will get medicines and fluids through this tube.  You will be given one or more of the following: ? A medicine to help you relax (sedative). ? A medicine to numb the area (local anesthetic). This medicine may be sprayed into your throat. It will make you feel more comfortable and keep you from gagging or coughing during the procedure. ? A medicine for pain.  A mouth guard  may be placed in your mouth to protect your teeth and to keep you from biting on the endoscope.  You will be asked to lie on your left side.  The endoscope will be lowered down your throat into your esophagus, stomach, and duodenum.  Air will be put into the endoscope. This will help your health care provider see better.  The lining of your esophagus, stomach, and duodenum will be examined.  Your health care provider  may: ? Take a tissue sample so it can be looked at in a lab (biopsy). ? Remove growths. ? Remove objects (foreign bodies) that are stuck. ? Treat any bleeding with medicines or other devices that stop tissue from bleeding. ? Widen (dilate) or stretch narrowed areas of your esophagus and stomach.  The endoscope will be taken out. The procedure may vary among health care providers and hospitals. What happens after the procedure?  Your blood pressure, heart rate, breathing rate, and blood oxygen level will be monitored often until the medicines you were given have worn off.  Do not eat or drink anything until the numbing medicine has worn off and your gag reflex has returned. This information is not intended to replace advice given to you by your health care provider. Make sure you discuss any questions you have with your health care provider. Document Released: 03/07/2005 Document Revised: 04/11/2016 Document Reviewed: 09/28/2015 Elsevier Interactive Patient Education  2018 [employer].  Colonoscopy, Adult A colonoscopy is an exam to look at the entire large intestine. During the exam, a lubricated, bendable tube is inserted into the anus and then passed into the rectum, colon, and other parts of the large intestine. A colonoscopy is often done as a part of normal colorectal screening or in response to certain symptoms, such as anemia, persistent diarrhea, abdominal pain, and blood in the stool. The exam can help screen for and diagnose medical problems, including:  Tumors.  Polyps.  Inflammation.  Areas of bleeding.  Tell a health care provider about:  Any allergies you have.  All medicines you are taking, including vitamins, herbs, eye drops, creams, and over-the-counter medicines.  Any problems you or family members have had with anesthetic medicines.  Any blood disorders you have.  Any surgeries you have had.  Any medical conditions you have.  Any problems you have had  passing stool. What are the risks? Generally, this is a safe procedure. However, problems may occur, including:  Bleeding.  A tear in the intestine.  A reaction to medicines given during the exam.  Infection (rare).  What happens before the procedure? Eating and drinking restrictions Follow instructions from your health care provider about eating and drinking, which may include:  A few days before the procedure - follow a low-fiber diet. Avoid nuts, seeds, dried fruit, raw fruits, and vegetables.  1-3 days before the procedure - follow a clear liquid diet. Drink only clear liquids, such as clear broth or bouillon, black coffee or tea, clear juice, clear soft drinks or sports drinks, gelatin dessert, and popsicles. Avoid any liquids that contain red or purple dye.  On the day of the procedure - do not eat or drink anything during the 2 hours before the procedure, or within the time period that your health care provider recommends.  Bowel prep If you were prescribed an oral bowel prep to clean out your colon:  Take it as told by your health care provider. Starting the day before your procedure, you will need to drink  a large amount of medicated liquid. The liquid will cause you to have multiple loose stools until your stool is almost clear or light green.  If your skin or anus gets irritated from diarrhea, you may use these to relieve the irritation: ? Medicated wipes, such as adult wet wipes with aloe and vitamin E. ? A skin soothing-product like petroleum jelly.  If you vomit while drinking the bowel prep, take a break for up to 60 minutes and then begin the bowel prep again. If vomiting continues and you cannot take the bowel prep without vomiting, call your health care provider.  General instructions  Ask your health care provider about changing or stopping your regular medicines. This is especially important if you are taking diabetes medicines or blood thinners.  Plan to have  someone take you home from the hospital or clinic. What happens during the procedure?  An IV tube may be inserted into one of your veins.  You will be given medicine to help you relax (sedative).  To reduce your risk of infection: ? Your health care team will wash or sanitize their hands. ? Your anal area will be washed with soap.  You will be asked to lie on your side with your knees bent.  Your health care provider will lubricate a long, thin, flexible tube. The tube will have a camera and a light on the end.  The tube will be inserted into your anus.  The tube will be gently eased through your rectum and colon.  Air will be delivered into your colon to keep it open. You may feel some pressure or cramping.  The camera will be used to take images during the procedure.  A small tissue sample may be removed from your body to be examined under a microscope (biopsy). If any potential problems are found, the tissue will be sent to a lab for testing.  If small polyps are found, your health care provider may remove them and have them checked for cancer cells.  The tube that was inserted into your anus will be slowly removed. The procedure may vary among health care providers and hospitals. What happens after the procedure?  Your blood pressure, heart rate, breathing rate, and blood oxygen level will be monitored until the medicines you were given have worn off.  Do not drive for 24 hours after the exam.  You may have a small amount of blood in your stool.  You may pass gas and have mild abdominal cramping or bloating due to the air that was used to inflate your colon during the exam.  It is up to you to get the results of your procedure. Ask your health care provider, or the department performing the procedure, when your results will be ready. This information is not intended to replace advice given to you by your health care provider. Make sure you discuss any questions you have  with your health care provider. Document Released: 11/01/2000 Document Revised: 09/04/2016 Document Reviewed: 01/16/2016 Elsevier Interactive Patient Education  2018 [employer].

## 2018-04-02 ENCOUNTER — Encounter (HOSPITAL_COMMUNITY)
Admission: RE | Admit: 2018-04-02 | Discharge: 2018-04-02 | Disposition: A | Discharge status: Home / Self Care | Payer: Medicare Other | Source: Ambulatory Visit | Attending: Gastroenterology | Admitting: Gastroenterology

## 2018-04-02 ENCOUNTER — Encounter (HOSPITAL_COMMUNITY): Payer: Self-pay | Admitting: *Deleted

## 2018-04-02 NOTE — Progress Notes (Signed)
Patient requests to use labs from 02/02/2018,  And CBC from 10/01/2017.  Dr Rick Duff aware.  OK to use.

## 2018-04-07 ENCOUNTER — Ambulatory Visit (HOSPITAL_COMMUNITY)
Admission: RE | Admit: 2018-04-07 | Discharge: 2018-04-07 | Disposition: A | Discharge status: Home / Self Care | Payer: Medicare Other | Source: Ambulatory Visit | Attending: Gastroenterology | Admitting: Gastroenterology

## 2018-04-07 ENCOUNTER — Telehealth: Payer: Self-pay | Admitting: Gastroenterology

## 2018-04-07 ENCOUNTER — Other Ambulatory Visit: Payer: Self-pay

## 2018-04-07 ENCOUNTER — Encounter (HOSPITAL_COMMUNITY): Payer: Self-pay | Admitting: Emergency Medicine

## 2018-04-07 ENCOUNTER — Ambulatory Visit (HOSPITAL_COMMUNITY): Payer: Medicare Other | Admitting: Anesthesiology

## 2018-04-07 ENCOUNTER — Encounter (HOSPITAL_COMMUNITY)
Admission: RE | Disposition: A | Discharge status: Home / Self Care | Payer: Self-pay | Source: Ambulatory Visit | Attending: Gastroenterology

## 2018-04-07 DIAGNOSIS — Z87891 Personal history of nicotine dependence: Secondary | ICD-10-CM | POA: Insufficient documentation

## 2018-04-07 DIAGNOSIS — Z803 Family history of malignant neoplasm of breast: Secondary | ICD-10-CM | POA: Insufficient documentation

## 2018-04-07 DIAGNOSIS — K648 Other hemorrhoids: Secondary | ICD-10-CM | POA: Diagnosis not present

## 2018-04-07 DIAGNOSIS — K2950 Unspecified chronic gastritis without bleeding: Secondary | ICD-10-CM | POA: Insufficient documentation

## 2018-04-07 DIAGNOSIS — Z801 Family history of malignant neoplasm of trachea, bronchus and lung: Secondary | ICD-10-CM | POA: Insufficient documentation

## 2018-04-07 DIAGNOSIS — Q438 Other specified congenital malformations of intestine: Secondary | ICD-10-CM | POA: Diagnosis not present

## 2018-04-07 DIAGNOSIS — G4730 Sleep apnea, unspecified: Secondary | ICD-10-CM | POA: Diagnosis not present

## 2018-04-07 DIAGNOSIS — K5730 Diverticulosis of large intestine without perforation or abscess without bleeding: Secondary | ICD-10-CM | POA: Diagnosis not present

## 2018-04-07 DIAGNOSIS — R194 Change in bowel habit: Secondary | ICD-10-CM | POA: Insufficient documentation

## 2018-04-07 DIAGNOSIS — K222 Esophageal obstruction: Secondary | ICD-10-CM | POA: Diagnosis not present

## 2018-04-07 DIAGNOSIS — K449 Diaphragmatic hernia without obstruction or gangrene: Secondary | ICD-10-CM | POA: Insufficient documentation

## 2018-04-07 DIAGNOSIS — K2990 Gastroduodenitis, unspecified, without bleeding: Secondary | ICD-10-CM | POA: Diagnosis not present

## 2018-04-07 DIAGNOSIS — K2970 Gastritis, unspecified, without bleeding: Secondary | ICD-10-CM

## 2018-04-07 DIAGNOSIS — Z13810 Encounter for screening for upper gastrointestinal disorder: Secondary | ICD-10-CM | POA: Insufficient documentation

## 2018-04-07 DIAGNOSIS — Z800 Family history of malignant neoplasm of digestive organs: Secondary | ICD-10-CM | POA: Diagnosis not present

## 2018-04-07 DIAGNOSIS — K644 Residual hemorrhoidal skin tags: Secondary | ICD-10-CM | POA: Diagnosis not present

## 2018-04-07 DIAGNOSIS — K649 Unspecified hemorrhoids: Secondary | ICD-10-CM | POA: Diagnosis not present

## 2018-04-07 DIAGNOSIS — K22719 Barrett's esophagus with dysplasia, unspecified: Secondary | ICD-10-CM

## 2018-04-07 DIAGNOSIS — M1990 Unspecified osteoarthritis, unspecified site: Secondary | ICD-10-CM | POA: Insufficient documentation

## 2018-04-07 DIAGNOSIS — K621 Rectal polyp: Secondary | ICD-10-CM | POA: Insufficient documentation

## 2018-04-07 DIAGNOSIS — Z6841 Body Mass Index (BMI) 40.0 and over, adult: Secondary | ICD-10-CM | POA: Diagnosis not present

## 2018-04-07 DIAGNOSIS — K2980 Duodenitis without bleeding: Secondary | ICD-10-CM | POA: Insufficient documentation

## 2018-04-07 DIAGNOSIS — Z85820 Personal history of malignant melanoma of skin: Secondary | ICD-10-CM | POA: Diagnosis not present

## 2018-04-07 HISTORY — PX: POLYPECTOMY: SHX5525

## 2018-04-07 HISTORY — PX: COLONOSCOPY WITH PROPOFOL: SHX5780

## 2018-04-07 HISTORY — PX: ESOPHAGOGASTRODUODENOSCOPY (EGD) WITH PROPOFOL: SHX5813

## 2018-04-07 HISTORY — PX: BIOPSY: SHX5522

## 2018-04-07 SURGERY — COLONOSCOPY WITH PROPOFOL
Anesthesia: General

## 2018-04-07 MED ORDER — PROMETHAZINE HCL 25 MG/ML IJ SOLN: 6.2500 mg | INTRAMUSCULAR | Status: DC | PRN

## 2018-04-07 MED ORDER — LIDOCAINE-HYDROCORTISONE ACE 3-2.5 % RE KIT: PACK | RECTAL | 0 refills | Status: DC

## 2018-04-07 MED ORDER — PANTOPRAZOLE SODIUM 40 MG PO TBEC: DELAYED_RELEASE_TABLET | ORAL | 11 refills | Status: DC

## 2018-04-07 MED ORDER — LACTATED RINGERS IV SOLN: INTRAVENOUS | Status: DC

## 2018-04-07 MED ORDER — HYDROMORPHONE HCL 1 MG/ML IJ SOLN: 0.2500 mg | INTRAMUSCULAR | Status: DC | PRN

## 2018-04-07 MED ORDER — HYDROCODONE-ACETAMINOPHEN 7.5-325 MG PO TABS: 1.0000 | ORAL_TABLET | Freq: Once | ORAL | Status: DC | PRN

## 2018-04-07 MED ORDER — MEPERIDINE HCL 100 MG/ML IJ SOLN: 6.2500 mg | INTRAMUSCULAR | Status: DC | PRN

## 2018-04-07 MED ORDER — CHLORHEXIDINE GLUCONATE CLOTH 2 % EX PADS: 6.0000 | MEDICATED_PAD | Freq: Once | CUTANEOUS | Status: DC

## 2018-04-07 MED ORDER — LACTATED RINGERS IV SOLN
INTRAVENOUS | Status: DC
  Administered 2018-04-07: 13:00:00 via INTRAVENOUS

## 2018-04-07 MED ORDER — PROPOFOL 500 MG/50ML IV EMUL
INTRAVENOUS | Status: DC | PRN
  Administered 2018-04-07: 125 ug/kg/min via INTRAVENOUS
  Administered 2018-04-07: 75 ug/kg/min via INTRAVENOUS

## 2018-04-07 MED ORDER — PROPOFOL 10 MG/ML IV BOLUS
INTRAVENOUS | Status: DC | PRN
  Administered 2018-04-07 (×4): 20 mg via INTRAVENOUS
  Administered 2018-04-07: 10 mg via INTRAVENOUS
  Administered 2018-04-07: 20 mg via INTRAVENOUS
  Administered 2018-04-07: 40 mg via INTRAVENOUS

## 2018-04-07 NOTE — Anesthesia Procedure Notes (Signed)
Procedure Name: MAC Date/Time: 04/07/2018 1:32 PM Performed by: Vista Deck, CRNA Pre-anesthesia Checklist: Patient identified, Emergency Drugs available, Suction available, Timeout performed and Patient being monitored Patient Re-evaluated:Patient Re-evaluated prior to induction Oxygen Delivery Method: Nasal Cannula

## 2018-04-07 NOTE — Transfer of Care (Signed)
Immediate Anesthesia Transfer of Care Note  Patient: Peter Mora  Procedure(s) Performed: COLONOSCOPY WITH PROPOFOL (N/A ) ESOPHAGOGASTRODUODENOSCOPY (EGD) WITH PROPOFOL (N/A ) BIOPSY POLYPECTOMY  Patient Location: PACU  Anesthesia Type:MAC  Level of Consciousness: awake, alert  and patient cooperative  Airway & Oxygen Therapy: Patient Spontanous Breathing  Post-op Assessment: Report given to RN and Post -op Vital signs reviewed and stable  Post vital signs: Reviewed and stable  Last Vitals:  Vitals Value Taken Time  BP    Temp    Pulse    Resp    SpO2      Last Pain:  Vitals:   04/07/18 1127  TempSrc: Oral  PainSc: 2       Patients Stated Pain Goal: 4 (16/01/09 3235)  Complications: No apparent anesthesia complications

## 2018-04-07 NOTE — Discharge Instructions (Signed)
NO OBVIOUS SOURCE FOR YOUR CHANGE IN BOWEL HABITS WAS IDENTIFIED. You had one polyp removed. 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  I biopsied your stomach, SMALL BOWEL AND COLON.    CONTINUE YOUR WEIGHT LOSS EFFORTS. CONTINUE YOUR WEIGHT LOSS EFFORTS.  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 THIS CAN SHORTEN YOUR LIFE EXPECTANCY 10 YEARS. AS WELL [facility] ACTIV[facility] CANCER GEN[facility].  [facility] IS ASSOCIATED WITH AN INCREASED RISK FOR CIRRHOSIS AND ALL CANCERS, INCLUDING ESOPHAGEAL AND COLON CANCER. FIRST STEP: ACHIEVE A WEIGHT OF 245 LBS OR LESS  TO GET YOUR BODY MASS INDEX(BMI) UNDER 40  THEN  REACH A WEIGHT OF 185 LBS OR LESS TO GET YOUR BODY MASS INDEX(BMI) UNDER 30.   DRINK WATER TO KEEP YOUR URINE LIGHT YELLOW.  FOLLOW A HIGH FIBER/LOW FAT DIET. AVOID ITEMS THAT CAUSE BLOATING. SEE INFO BELOW.  FOLLOW RECOMMENDATIONS FOR CONTROLLING REFLUX. SEE INFO BELOW.  TO PREVENT ESOPHAGEAL RINGS AND TO TREAT GASTRITIS, START PROTONIX. TAKE 30 MINUTES PRIOR TO BREAKFAST.   USE PREPARATION H FOUR TIMES  A DAY for 2 weeks TO RELIEVE RECTAL PAIN/PRESSURE/BLEEDING. SEE SURGERY TO FIX YOUR HEMORRHOIDS when you ARE READY.Marland Kitchen    YOUR BIOPSY RESULTS WILL BE BACK IN 7 DAYS.  FOLLOW UP IN 4 MOS TO REASSESS YOUR BOWEL HABITS.  Next colonoscopy in 10-15 years IF THE BENEFITS OUTWEIGH THE RISKS.    ENDOSCOPY Care After Read the instructions outlined below and refer to this sheet in the next week. These discharge instructions provide you with general information on caring for yourself after you leave the hospital. While your treatment has been planned according to the most current medical practices available, unavoidable complications occasionally occur. If you have any problems or questions  after discharge, call DR. Johnattan Strassman, [phone].  ACTIVITY  You may resume your regular activity, but move at a slower pace for the next 24 hours.   Take frequent rest periods for the next 24 hours.   Walking will help get rid of the air and reduce the bloated feeling in your belly (abdomen).   No driving for 24 hours (because of the medicine (anesthesia) used during the test).   You may shower.   Do not sign any important legal documents or operate any machinery for 24 hours (because of the anesthesia used during the test).    NUTRITION  Drink plenty of fluids.   You may resume your normal diet as instructed by your doctor.   Begin with a light meal and progress to your normal diet. Heavy or fried foods are harder to digest and may make you feel sick to your stomach (nauseated).   Avoid alcoholic beverages for 24 hours or as instructed.    MEDICATIONS  You may resume your normal medications.   WHAT YOU CAN EXPECT TODAY  Some feelings of bloating in the abdomen.   Passage of more gas than usual.   Spotting of blood in your stool or on the toilet paper  .  IF YOU HAD POLYPS REMOVED DURING THE ENDOSCOPY:  Eat a soft diet IF YOU HAVE NAUSEA, BLOATING, ABDOMINAL PAIN, OR VOMITING.    FINDING OUT THE RESULTS OF YOUR TEST Not all test results are available during your visit. DR. Oneida Alar WILL CALL YOU WITHIN 14 DAYS  OF YOUR PROCEDUE WITH YOUR RESULTS. Do not assume everything is normal if you have not heard from DR. Hawk Mones, CALL HER OFFICE AT [phone].  SEEK IMMEDIATE MEDICAL ATTENTION AND CALL THE OFFICE: [phone] IF:  You have more than a spotting of blood in your stool.   Your belly is swollen (abdominal distention).   You are nauseated or vomiting.   You have a temperature over 101F.   You have abdominal pain or discomfort that is severe or gets worse throughout the day.    Lifestyle and home remedies TO HELP CONTROL REFLUX  You may eliminate or  reduce the frequency of heartburn by making the following lifestyle changes:   Control your weight. Being overweight is a major risk factor for heartburn and GERD. Excess pounds put pressure on your abdomen, pushing up your stomach and causing acid to back up into your esophagus.    Eat smaller meals. 4 TO 6 MEALS A DAY. This reduces pressure on the lower esophageal sphincter, helping to prevent the valve from opening and acid from washing back into your esophagus.    Loosen your belt. Clothes that fit tightly around your waist put pressure on your abdomen and the lower esophageal sphincter.    Eliminate heartburn triggers. Everyone has specific triggers. Common triggers such as fatty or fried foods, spicy food, tomato sauce, carbonated beverages, alcohol, chocolate, mint, garlic, onion, caffeine and nicotine may make heartburn worse.    Avoid stooping or bending. Tying your shoes is OK. Bending over for longer periods to weed your garden isn't, especially soon after eating.    Don't lie down after a meal. Wait at least three to four hours after eating before going to bed, and don't lie down right after eating.    Alternative medicine  Several home remedies exist for treating GERD, but they provide only temporary relief. They include drinking baking soda (sodium bicarbonate) added to water or drinking other fluids such as baking soda mixed with cream of tartar and water.   Although these liquids create temporary relief by neutralizing, washing away or buffering acids, eventually they aggravate the situation by adding gas and fluid to your stomach, increasing pressure and causing more acid reflux. Further, adding more sodium to your diet may increase your blood pressure and add stress to your heart, and excessive bicarbonate ingestion can alter the acid-base balance in your body.  Gastritis/DUODENITIS  Gastritis is an inflammation (the body's way of reacting to injury and/or infection)  of the stomach. DUODENITIS is an inflammation (the body's way of reacting to injury and/or infection) of the FIRST PART OF THE SMALL INTESTINES. It is often caused by bacterial (germ) infections. It can also be caused BY ASPIRIN, BC/GOODY POWDER'S, (IBUPROFEN) MOTRIN, OR ALEVE (NAPROXEN), chemicals (including alcohol), SPICY FOODS, and medications. This illness may be associated with generalized malaise (feeling tired, not well), UPPER ABDOMINAL STOMACH cramps, and fever. One common bacterial cause of gastritis is an organism known as H. Pylori. This can be treated with antibiotics.      High-Fiber Diet A high-fiber diet changes your normal diet to include more whole grains, legumes, fruits, and vegetables. Changes in the diet involve replacing refined carbohydrates with unrefined foods. The calorie level of the diet is essentially unchanged. The Dietary Reference Intake (recommended amount) for adult males is 38 grams per day. For adult females, it is 25 grams per day. Pregnant and lactating women should consume 28 grams of fiber per day. Fiber is the intact part  of a plant that is not broken down during digestion. Functional fiber is fiber that has been isolated from the plant to provide a beneficial effect in the body.   PURPOSE  Increase stool bulk.   Ease and regulate bowel movements.   Lower cholesterol.   REDUCE RISK OF COLON CANCER   INDICATIONS THAT YOU NEED MORE FIBER  Constipation and hemorrhoids.   Uncomplicated diverticulosis (intestine condition) and irritable bowel syndrome.   Weight management.   As a protective measure against hardening of the arteries (atherosclerosis), diabetes, and cancer.   GUIDELINES FOR INCREASING FIBER IN THE DIET  Start adding fiber to the diet slowly. A gradual increase of about 5 more grams (2 slices of whole-wheat bread, 2 servings of most fruits or vegetables, or 1 bowl of high-fiber cereal) per day is best. Too rapid an increase in fiber  may result in constipation, flatulence, and bloating.   Drink enough water and fluids to keep your urine clear or pale yellow. Water, juice, or caffeine-free drinks are recommended. Not drinking enough fluid may cause constipation.   Eat a variety of high-fiber foods rather than one type of fiber.   Try to increase your intake of fiber through using high-fiber foods rather than fiber pills or supplements that contain small amounts of fiber.   The goal is to change the types of food eaten. Do not supplement your present diet with high-fiber foods, but replace foods in your present diet.   INCLUDE A VARIETY OF FIBER SOURCES  Replace refined and processed grains with whole grains, canned fruits with fresh fruits, and incorporate other fiber sources. White rice, white breads, and most bakery goods contain little or no fiber.   Brown whole-grain rice, buckwheat oats, and many fruits and vegetables are all good sources of fiber. These include: broccoli, Brussels sprouts, cabbage, cauliflower, beets, sweet potatoes, white potatoes (skin on), carrots, tomatoes, eggplant, squash, berries, fresh fruits, and dried fruits.   Cereals appear to be the richest source of fiber. Cereal fiber is found in whole grains and bran. Bran is the fiber-rich outer coat of cereal grain, which is largely removed in refining. In whole-grain cereals, the bran remains. In breakfast cereals, the largest amount of fiber is found in those with "bran" in their names. The fiber content is sometimes indicated on the label.   You may need to include additional fruits and vegetables each day.   In baking, for 1 cup white flour, you may use the following substitutions:   1 cup whole-wheat flour minus 2 tablespoons.   1/2 cup white flour plus 1/2 cup whole-wheat flour.   Low-Fat Diet BREADS, CEREALS, PASTA, RICE, DRIED PEAS, AND BEANS These products are high in carbohydrates and most are low in fat. Therefore, they can be  increased in the diet as substitutes for fatty foods. They too, however, contain calories and should not be eaten in excess. Cereals can be eaten for snacks as well as for breakfast.  Include foods that contain fiber (fruits, vegetables, whole grains, and legumes). Research shows that fiber may lower blood cholesterol levels, especially the water-soluble fiber found in fruits, vegetables, oat products, and legumes. FRUITS AND VEGETABLES It is good to eat fruits and vegetables. Besides being sources of fiber, both are rich in vitamins and some minerals. They help you get the daily allowances of these nutrients. Fruits and vegetables can be used for snacks and desserts. MEATS Limit lean meat, chicken, [place], and fish to no more than 6  ounces per day. Beef, Pork, and Lamb Use lean cuts of beef, pork, and lamb. Lean cuts include:  Extra-lean ground beef.  Arm roast.  Sirloin tip.  Center-cut ham.  Round steak.  Loin chops.  Rump roast.  Tenderloin.  Trim all fat off the outside of meats before cooking. It is not necessary to severely decrease the intake of red meat, but lean choices should be made. Lean meat is rich in protein and contains a highly absorbable form of iron. Premenopausal women, in particular, should avoid reducing lean red meat because this could increase the risk for low red blood cells (iron-deficiency anemia). The organ meats, such as liver, sweetbreads, kidneys, and brain are very rich in cholesterol. They should be limited. Chicken and [place] These are good sources of protein. The fat of poultry can be reduced by removing the skin and underlying fat layers before cooking. Chicken and [place] can be substituted for lean red meat in the diet. Poultry should not be fried or covered with high-fat sauces. Fish and Shellfish Fish is a good source of protein. Shellfish contain cholesterol, but they usually are low in saturated fatty acids. The preparation of fish is important. Like  chicken and [place], they should not be fried or covered with high-fat sauces. EGGS Egg whites contain no fat or cholesterol. They can be eaten often. Try 1 to 2 egg whites instead of whole eggs in recipes or use egg substitutes that do not contain yolk. MILK AND DAIRY PRODUCTS Use skim or 1% milk instead of 2% or whole milk. Decrease whole milk, natural, and processed cheeses. Use nonfat or low-fat (2%) cottage cheese or low-fat cheeses made from vegetable oils. Choose nonfat or low-fat (1 to 2%) yogurt. Experiment with evaporated skim milk in recipes that call for heavy cream. Substitute low-fat yogurt or low-fat cottage cheese for sour cream in dips and salad dressings. Have at least 2 servings of low-fat dairy products, such as 2 glasses of skim (or 1%) milk each day to help get your daily calcium intake.  FATS AND OILS Reduce the total intake of fats, especially saturated fat. Butterfat, lard, and beef fats are high in saturated fat and cholesterol. These should be avoided as much as possible. Vegetable fats do not contain cholesterol, but certain vegetable fats, such as coconut oil, palm oil, and palm kernel oil are very high in saturated fats. These should be limited. These fats are often used in bakery goods, processed foods, popcorn, oils, and nondairy creamers. Vegetable shortenings and some peanut butters contain hydrogenated oils, which are also saturated fats. Read the labels on these foods and check for saturated vegetable oils. Unsaturated vegetable oils and fats do not raise blood cholesterol. However, they should be limited because they are fats and are high in calories. Total fat should still be limited to 30% of your daily caloric intake. Desirable liquid vegetable oils are corn oil, cottonseed oil, olive oil, canola oil, safflower oil, soybean oil, and sunflower oil. Peanut oil is not as good, but small amounts are acceptable. Buy a heart-healthy tub margarine that has no partially  hydrogenated oils in the ingredients. Mayonnaise and salad dressings often are made from unsaturated fats, but they should also be limited because of their high calorie and fat content. Seeds, nuts, peanut butter, olives, and avocados are high in fat, but the fat is mainly the unsaturated type. These foods should be limited mainly to avoid excess calories and fat. OTHER EATING TIPS Snacks  Most sweets  should be limited as snacks. They tend to be rich in calories and fats, and their caloric content outweighs their nutritional value. Some good choices in snacks are graham crackers, melba toast, soda crackers, bagels (no egg), English muffins, fruits, and vegetables. These snacks are preferable to snack crackers, [place] fries, and chips. Popcorn should be air-popped or cooked in small amounts of liquid vegetable oil. Desserts Eat fruit, low-fat yogurt, and fruit ices. AVOID pastries, cake, and cookies. Sherbet, angel food cake, gelatin dessert, frozen low-fat yogurt, or other frozen products that do not contain saturated fat (pure fruit juice bars, frozen ice pops) are also acceptable.  COOKING METHODS Choose those methods that use little or no fat. They include: Poaching.  Braising.  Steaming.  Grilling.  Baking.  Stir-frying.  Broiling.  Microwaving.  Foods can be cooked in a nonstick pan without added fat, or use a nonfat cooking spray in regular cookware. Limit fried foods and avoid frying in saturated fat. Add moisture to lean meats by using water, broth, cooking wines, and other nonfat or low-fat sauces along with the cooking methods mentioned above. Soups and stews should be chilled after cooking. The fat that forms on top after a few hours in the refrigerator should be skimmed off. When preparing meals, avoid using excess salt. Salt can contribute to raising blood pressure in some people. EATING AWAY FROM HOME Order entres, potatoes, and vegetables without sauces or butter. When meat  exceeds the size of a deck of cards (3 to 4 ounces), the rest can be taken home for another meal. Choose vegetable or fruit salads and ask for low-calorie salad dressings to be served on the side. Use dressings sparingly. Limit high-fat toppings, such as bacon, crumbled eggs, cheese, sunflower seeds, and olives. Ask for heart-healthy tub margarine instead of butter.  Hemorrhoids Hemorrhoids are dilated (enlarged) veins around the rectum. Sometimes clots will form in the veins. This makes them swollen and painful. These are called thrombosed hemorrhoids. Causes of hemorrhoids include:  Constipation.   Straining to have a bowel movement.   HEAVY LIFTING HOME CARE INSTRUCTIONS  Eat a well balanced diet and drink 6 to 8 glasses of water every day to avoid constipation. You may also use a bulk laxative.   Avoid straining to have bowel movements.   Keep anal area dry and clean.   Do not use a donut shaped pillow or sit on the toilet for long periods. This increases blood pooling and pain.   Move your bowels when your body has the urge; this will require less straining and will decrease pain and pressure.

## 2018-04-07 NOTE — Op Note (Signed)
[facility] Patient Name: Peter Mora Procedure Date: 04/07/2018 1:19 PM MRN: [id number] Date of Birth: 01/08/1946 Attending MD: Barney Drain MD, MD CSN: [id number] Age: 72 Admit Type: Outpatient Procedure:                Colonoscopy WITH COLD SNARE POLYPECTOMY/FORCEPS                            BIOPSY Indications:              Change in bowel habits ASSOCIATED WITH NOCTURNAL                            STOOLS. Providers:                Barney Drain MD, MD, Janeece Riggers, RN, Randa Spike, Technician Referring MD:             Caren Macadam Medicines:                Propofol per Anesthesia Complications:            No immediate complications. Estimated Blood Loss:     Estimated blood loss was minimal. Procedure:                Pre-Anesthesia Assessment:                           - Prior to the procedure, a History and Physical                            was performed, and patient medications and                            allergies were reviewed. The patient's tolerance of                            previous anesthesia was also reviewed. The risks                            and benefits of the procedure and the sedation                            options and risks were discussed with the patient.                            All questions were answered, and informed consent                            was obtained. Prior Anticoagulants: The patient has                            taken previous NSAID medication, last dose was 1                            day prior  to procedure. ASA Grade Assessment: II -                            A patient with mild systemic disease. After                            reviewing the risks and benefits, the patient was                            deemed in satisfactory condition to undergo the                            procedure. After obtaining informed consent, the                            colonoscope was passed  under direct vision.                            Throughout the procedure, the patient's blood                            pressure, pulse, and oxygen saturations were                            monitored continuously. The [id number] ([id number])                            scope was introduced through the anus and advanced                            to the 10 cm into the ileum. The colonoscopy was                            somewhat difficult due to a tortuous colon.                            Successful completion of the procedure was aided by                            increasing the dose of sedation medication,                            straightening and shortening the scope to obtain                            bowel loop reduction and COLOWRAP. The patient                            tolerated the procedure fairly well. The quality of                            the bowel preparation was good. The terminal ileum,  ileocecal valve, appendiceal orifice, and rectum                            were photographed. Scope In: 1:44:12 PM Scope Out: 1:58:38 PM Total Procedure Duration: 0 hours 14 minutes 26 seconds  Findings:      The terminal ileum appeared normal.      A 4 mm polyp was found in the rectum. The polyp was sessile. The polyp       was removed with a cold snare. Resection and retrieval were complete.      The recto-sigmoid colon and sigmoid colon were moderately redundant.       Biopsies for histology were taken with a cold forceps from the right       colon and left colon for evaluation of microscopic colitis.      External and internal hemorrhoids were found during retroflexion. The       hemorrhoids were moderate. Impression:               - The examined portion of the ileum was normal.                           - One 4 mm polyp in the rectum, removed with a cold                            snare. Resected and retrieved.                           - Redundant  LEFT colon.                           - External and internal hemorrhoids.                           -NO SOURCE FOR CHANGE INBOWEL HABITS IDENTIFIED. PT                            THINKS HE MAY BE ALLERGIC TO EGGS. Moderate Sedation:      Per Anesthesia Care Recommendation:           - Repeat colonoscopy is not recommended due to                            current age (13 years or older) for surveillance.                           - High fiber diet.                           - Continue present medications.                           - Await pathology results.                           - Return to my office in 4 months.                           -  Patient has a contact number available for                            emergencies. The signs and symptoms of potential                            delayed complications were discussed with the                            patient. Return to normal activities tomorrow.                            Written discharge instructions were provided to the                            patient. Procedure Code(s):        --- Professional ---                           [phone], Colonoscopy, flexible; with removal of                            tumor(s), polyp(s), or other lesion(s) by snare                            technique                           45380, 97, Colonoscopy, flexible; with biopsy,                            single or multiple Diagnosis Code(s):        --- Professional ---                           K64.8, Other hemorrhoids                           K62.1, Rectal polyp                           R19.4, Change in bowel habit                           Q43.8, Other specified congenital malformations of                            intestine CPT copyright 2017 [employer]. All rights reserved. The codes documented in this report are preliminary and upon coder review may  be revised to meet current compliance requirements. Barney Drain,  MD Barney Drain MD, MD 04/07/2018 2:19:26 PM This report has been signed electronically. Number of Addenda: 0

## 2018-04-07 NOTE — Op Note (Signed)
[facility] Patient Name: Peter Mora Procedure Date: 04/07/2018 2:02 PM MRN: [id number] Date of Birth: [birth year] Attending MD: Barney Drain MD, MD CSN: [id number] Age: 72 Admit Type: Outpatient Procedure:                Upper GI endoscopy WITH COLD FORCEPS BIOPSY Indications:              Screening for Barrett's esophagus Providers:                Barney Drain MD, MD, Janeece Riggers, RN, Randa Spike, Technician Referring MD:             Caren Macadam Medicines:                Propofol per Anesthesia Complications:            No immediate complications. Estimated Blood Loss:     Estimated blood loss was minimal. Procedure:                Pre-Anesthesia Assessment:                           - Prior to the procedure, a History and Physical                            was performed, and patient medications and                            allergies were reviewed. The patient's tolerance of                            previous anesthesia was also reviewed. The risks                            and benefits of the procedure and the sedation                            options and risks were discussed with the patient.                            All questions were answered, and informed consent                            was obtained. Prior Anticoagulants: The patient has                            taken previous NSAID medication, last dose was 1                            day prior to procedure. ASA Grade Assessment: II -                            A patient with mild systemic disease. After  reviewing the risks and benefits, the patient was                            deemed in satisfactory condition to undergo the                            procedure. After obtaining informed consent, the                            endoscope was passed under direct vision.                            Throughout the procedure, the patient's blood                             pressure, pulse, and oxygen saturations were                            monitored continuously. The EG-[id number] ([id number])                            scope was introduced through the mouth, and                            advanced to the second part of duodenum. The upper                            GI endoscopy was somewhat difficult due to the                            patient's agitation. Successful completion of the                            procedure was aided by increasing the dose of                            sedation medication. The patient tolerated the                            procedure fairly well. Scope In: 2:04:47 PM Scope Out: 2:10:41 PM Total Procedure Duration: 0 hours 5 minutes 54 seconds  Findings:      A low-grade of narrowing Schatzki ring was found at the gastroesophageal       junction.      A medium-sized hiatal hernia was present.      Diffuse moderate inflammation characterized by congestion (edema),       erosions and erythema was found in the entire examined stomach. Biopsies       were taken with a cold forceps for Helicobacter pylori testing(3:       ANTRUM, 2: BODY).      Diffuse moderate inflammation characterized by congestion (edema),       erosions and erythema was found in the entire duodenum. Biopsies for       histology were taken with a cold forceps for evaluation of celiac  disease(2: BULB, 4: 2ND PORTION). Impression:               - Low-grade of narrowing Schatzki ring. NO                            BARRETT'S ESOPHAGUS.                           - Medium-sized hiatal hernia.                           - MODERATE Gastritis/Duodenitis. Moderate Sedation:      Per Anesthesia Care Recommendation:           - Await pathology results.                           - Low fat diet.                           - Continue present medications.                           - Use Protonix (pantoprazole) 40 mg PO daily                             indefinitely.                           - Return to my office in 4 months.                           - Patient has a contact number available for                            emergencies. The signs and symptoms of potential                            delayed complications were discussed with the                            patient. Return to normal activities tomorrow.                            Written discharge instructions were provided to the                            patient. Procedure Code(s):        --- Professional ---                           [phone], Esophagogastroduodenoscopy, flexible,                            transoral; with biopsy, single or multiple Diagnosis Code(s):        --- Professional ---  K22.2, Esophageal obstruction                           K44.9, Diaphragmatic hernia without obstruction or                            gangrene                           K29.70, Gastritis, unspecified, without bleeding                           K29.80, Duodenitis without bleeding                           Z13.810, Encounter for screening for upper                            gastrointestinal disorder CPT copyright 2017 [employer]. All rights reserved. The codes documented in this report are preliminary and upon coder review may  be revised to meet current compliance requirements. Barney Drain, MD Barney Drain MD, MD 04/07/2018 2:24:13 PM This report has been signed electronically. Number of Addenda: 0

## 2018-04-07 NOTE — H&P (Signed)
Primary Care Physician:  Peter Everts, MD Primary Gastroenterologist:  Dr. Oneida Mora  Pre-Procedure History & Physical: HPI:  Peter Mora is a 72 y.o. male here for Change in bowel habits.  Past Medical History:  Diagnosis Date  . Arthritis   . Sleep apnea     Past Surgical History:  Procedure Laterality Date  . BACK SURGERY     excision of melanoma  . KNEE ARTHROSCOPY WITH MEDIAL MENISECTOMY Left 10/02/2017   Procedure: LEFT KNEE ARTHROSCOPY WITH PARTIAL MEDIAL MENISECTOMY;  Surgeon: Peter Civil, MD;  Location: [facility];  Service: Orthopedics;  Laterality: Left;    Prior to Admission medications   Medication Sig Start Date End Date Taking? Authorizing Provider  hydrocortisone cream 1 % Apply 1 application topically See admin instructions. Apply to scalp daily after shower   Yes [provider]  meloxicam (MOBIC) 15 MG tablet Take 1 tablet (15 mg total) by mouth daily. Patient taking differently: Take 15 mg by mouth daily with supper.  11/04/17  Yes Peter Civil, MD  Na Sulfate-K Sulfate-Mg Sulf (SUPREP BOWEL PREP KIT) 17.5-3.13-1.6 GM/177ML SOLN Take 1 kit by mouth as directed. 03/05/18  Yes Peter Binder, MD    Allergies as of 03/05/2018  . (No Known Allergies)    Family History  Problem Relation Age of Onset  . Cancer Mother        esophageal  . Cancer Sister        colon and breast   . Cancer Brother        colon  . Cancer Father 96       agressive lung cancer    Social History   Socioeconomic History  . Marital status: Married    Spouse name: Peter Hubert "shelly"  . Number of children: 3  . Years of education: 16  . Highest education level: Bachelor's degree (e.g., BA, AB, BS)  Occupational History  . Occupation: woodworker    Comment: retired  . 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 Financial resource strain: Not on file  . Food insecurity:    Worry: Not on file    Inability: Not on file  . Transportation  needs:    Medical: Not on file    Non-medical: Not on file  Tobacco Use  . Smoking status: Former Smoker    Years: 35.00    Types: Cigarettes    Last attempt to quit: 09/15/2002    Years since quitting: 15.5  . Smokeless tobacco: Never Used  Substance and Sexual Activity  . Alcohol use: Yes    Alcohol/week: 25.2 oz    Types: 21 Cans of beer, 21 Shots of liquor per week    Comment: 6 drinks per day  . Drug use: No  . Sexual activity: Not Currently    Birth control/protection: None  Lifestyle  . Physical activity:    Days per week: Not on file    Minutes per session: Not on file  . Stress: Not on file  Relationships  . Social connections:    Talks on phone: Not on file    Gets together: Not on file    Attends religious service: Not on file    Active member of club or organization: Not on file    Attends meetings of clubs or organizations: Not on file    Relationship status: Not on file  . Intimate partner violence:    Fear of current or ex partner: Not on file  Emotionally abused: Not on file    Physically abused: Not on file    Forced sexual activity: Not on file  Other Topics Concern  . Not on file  Social History Narrative   Lives with second wife Peter Mora, [place] woman   She travels a lot   Retired Armed forces technical officer   No reg exercise    Review of Systems: See HPI, otherwise negative ROS   Physical Exam: BP 127/87   Pulse 99   Temp 98.2 F (36.8 C) (Oral)   Resp 18   Ht '5\' 6"'$  (1.676 m)   Wt 257 lb (116.6 kg)   SpO2 98%   BMI 41.48 kg/m  General:   Alert,  pleasant and cooperative in NAD Head:  Normocephalic and atraumatic. Neck:  Supple; Lungs:  Clear throughout to auscultation.    Heart:  Regular rate and rhythm. Abdomen:  Soft, nontender and nondistended. Normal bowel sounds, without guarding, and without rebound.   Neurologic:  Alert and  oriented x4;  grossly normal neurologically.  Impression/Plan:     Change in bowel  habits  PLAN:  1. TCS/EGD TODAY DISCUSSED PROCEDURE, BENEFITS, & RISKS: < 1% chance of medication reaction, bleeding, perforation, or rupture of spleen/liver.

## 2018-04-07 NOTE — Anesthesia Preprocedure Evaluation (Signed)
Anesthesia Evaluation  Patient identified by MRN, date of birth, ID band Patient awake    Reviewed: Allergy & Precautions, H&P , NPO status , Patient's Chart, lab work & pertinent test results, reviewed documented beta blocker date and time   Airway Mallampati: II  TM Distance: >3 FB Neck ROM: full    Dental no notable dental hx. (+) Teeth Intact, Dental Advidsory Given   Pulmonary neg pulmonary ROS, sleep apnea , former smoker,    Pulmonary exam normal breath sounds clear to auscultation       Cardiovascular Exercise Tolerance: Good negative cardio ROS   Rhythm:regular Rate:Normal     Neuro/Psych negative neurological ROS  negative psych ROS   GI/Hepatic negative GI ROS, Neg liver ROS,   Endo/Other  negative endocrine ROSMorbid obesity  Renal/GU negative Renal ROS  negative genitourinary   Musculoskeletal   Abdominal   Peds  Hematology negative hematology ROS (+)   Anesthesia Other Findings Long standing h/o IBS  Reproductive/Obstetrics negative OB ROS                             Anesthesia Physical Anesthesia Plan  ASA: II  Anesthesia Plan: General   Post-op Pain Management:    Induction:   PONV Risk Score and Plan:   Airway Management Planned:   Additional Equipment:   Intra-op Plan:   Post-operative Plan:   Informed Consent: I have reviewed the patients History and Physical, chart, labs and discussed the procedure including the risks, benefits and alternatives for the proposed anesthesia with the patient or authorized representative who has indicated his/her understanding and acceptance.   Dental Advisory Given  Plan Discussed with: CRNA and Anesthesiologist  Anesthesia Plan Comments:         Anesthesia Quick Evaluation

## 2018-04-07 NOTE — Anesthesia Postprocedure Evaluation (Signed)
Anesthesia Post Note  Patient: Peter Mora  Procedure(s) Performed: COLONOSCOPY WITH PROPOFOL (N/A ) ESOPHAGOGASTRODUODENOSCOPY (EGD) WITH PROPOFOL (N/A ) BIOPSY POLYPECTOMY  Patient location during evaluation: PACU Anesthesia Type: General Level of consciousness: awake and alert, oriented and patient cooperative Pain management: satisfactory to patient Vital Signs Assessment: post-procedure vital signs reviewed and stable Respiratory status: spontaneous breathing Postop Assessment: no apparent nausea or vomiting Anesthetic complications: no     Last Vitals:  Vitals:   04/02/18 1004 04/07/18 1127  BP: (!) 130/95 127/87  Pulse: 97 99  Resp: 18 18  Temp: 36.8 C 36.8 C  SpO2: 98% 98%    Last Pain:  Vitals:   04/07/18 1127  TempSrc: Oral  PainSc: 2                  Krysia Zahradnik

## 2018-04-07 NOTE — Telephone Encounter (Signed)
Patient needs follow-up appt in 4 months. Follow-up after EGD/TCS.

## 2018-04-08 ENCOUNTER — Encounter: Payer: Self-pay | Admitting: Gastroenterology

## 2018-04-08 NOTE — Telephone Encounter (Signed)
Patient scheduled and letter sent  °

## 2018-04-15 ENCOUNTER — Encounter (HOSPITAL_COMMUNITY): Payer: Self-pay | Admitting: Gastroenterology

## 2018-04-16 NOTE — Progress Notes (Signed)
Pt is aware.  

## 2018-04-16 NOTE — Progress Notes (Signed)
PATIENT SCHEDULED AND ON RECALL  °

## 2018-06-02 DIAGNOSIS — L821 Other seborrheic keratosis: Secondary | ICD-10-CM | POA: Diagnosis not present

## 2018-06-02 DIAGNOSIS — L812 Freckles: Secondary | ICD-10-CM | POA: Diagnosis not present

## 2018-06-02 DIAGNOSIS — Z85820 Personal history of malignant melanoma of skin: Secondary | ICD-10-CM | POA: Diagnosis not present

## 2018-06-02 DIAGNOSIS — D1801 Hemangioma of skin and subcutaneous tissue: Secondary | ICD-10-CM | POA: Diagnosis not present

## 2018-06-02 DIAGNOSIS — L570 Actinic keratosis: Secondary | ICD-10-CM | POA: Diagnosis not present

## 2018-06-22 ENCOUNTER — Other Ambulatory Visit: Payer: Self-pay | Admitting: Orthopedic Surgery

## 2018-06-22 NOTE — Telephone Encounter (Signed)
Patient is asking for a refill of Meloxicam 15 mg. He is asking if Dr. Aline Mora would do a 90 day supply instead of 30 days. Patient would like for you to call him so he can explain his situation.   Meloxicam 15 mg  Qty30  Tablets Take 1 tablet (15 mg total) by mouth daily. Taking differently: Take 15 mg by mouth daily with supper.  PATIENT USES [facility]

## 2018-06-23 MED ORDER — MELOXICAM 15 MG PO TABS: 15.0000 mg | ORAL_TABLET | Freq: Every day | ORAL | 1 refills | Status: DC

## 2018-08-03 ENCOUNTER — Ambulatory Visit (INDEPENDENT_AMBULATORY_CARE_PROVIDER_SITE_OTHER): Payer: Medicare Other | Admitting: Gastroenterology

## 2018-08-03 ENCOUNTER — Encounter: Payer: Self-pay | Admitting: Gastroenterology

## 2018-08-03 VITALS — BP 137/87 | HR 68 | Temp 97.1°F | Ht 66.0 in | Wt 245.4 lb

## 2018-08-03 DIAGNOSIS — R194 Change in bowel habit: Secondary | ICD-10-CM | POA: Diagnosis not present

## 2018-08-03 DIAGNOSIS — K580 Irritable bowel syndrome with diarrhea: Secondary | ICD-10-CM | POA: Diagnosis not present

## 2018-08-03 DIAGNOSIS — K219 Gastro-esophageal reflux disease without esophagitis: Secondary | ICD-10-CM | POA: Diagnosis not present

## 2018-08-03 NOTE — Progress Notes (Signed)
Referring Provider: [name], MD Primary Care Physician:  [name], MD Primary GI; Dr. Oneida Alar   Chief Complaint  Patient presents with  . change in bowel habits    still goes often but is becoming less than before    HPI:   Peter Mora is a 72 y.o. male presenting today with a history of GERD and frequent stools. EGD, colonoscopy on file in interim from last visit. No evidence of Barrett's. Colonoscopy with hyperplastic polyp.    BMs 4-6 times per day. Feels like it is better than before.   Has always been an egg eater. Stopped eating eggs, which helped a lot. Feels like he may be allergic to tomato sauces and such. Will be undergoing allergy testing soon. Does not want to start supportive meds until after this. Does not like taking extra medication. Protonix daily. Mobic daily. History of arthritis.   Lost weight since being seen purposefully. Very proud of this and wanted Dr. Oneida Alar to know. Encouraged and applauded on weight loss.  Trying to eat 2 pieces of fruit per day.   Past Medical History:  Diagnosis Date  . Arthritis   . Sleep apnea     Past Surgical History:  Procedure Laterality Date  . BACK SURGERY     excision of melanoma  . BIOPSY  04/07/2018   Procedure: BIOPSY;  Surgeon: Danie Binder, MD;  Location: [facility];  Service: Endoscopy;;  random colon biopsy  . COLONOSCOPY WITH PROPOFOL N/A 04/07/2018   one 4 mm hyperplastic rectal polyp, redundant left colon, external and internal hemorrhoids  . ESOPHAGOGASTRODUODENOSCOPY (EGD) WITH PROPOFOL N/A 04/07/2018   Negative Barrett's. medium sized hiatal hernia. moderate gastritis/duodenitis  . KNEE ARTHROSCOPY WITH MEDIAL MENISECTOMY Left 10/02/2017   Procedure: LEFT KNEE ARTHROSCOPY WITH PARTIAL MEDIAL MENISECTOMY;  Surgeon: Carole Civil, MD;  Location: [facility];  Service: Orthopedics;  Laterality: Left;  . POLYPECTOMY  04/07/2018   Procedure: POLYPECTOMY;  Surgeon: Danie Binder, MD;  Location: [facility];  Service: Endoscopy;;  rectal polyp cs    Current Outpatient Medications  Medication Sig Dispense Refill  . hydrocortisone cream 1 % Apply 1 application topically See admin instructions. Apply to scalp daily after shower    . meloxicam (MOBIC) 15 MG tablet Take 1 tablet (15 mg total) by mouth daily. 90 tablet 1  . pantoprazole (PROTONIX) 40 MG tablet 1 po 30 mins prior to first meal 30 tablet 11   No current facility-administered medications for this visit.     Allergies as of 08/03/2018  . (No Known Allergies)    Family History  Problem Relation Age of Onset  . Cancer Mother        esophageal  . Cancer Sister        colon and breast   . Cancer Brother        colon  . Cancer Father 78       agressive lung cancer    Social History   Socioeconomic History  . Marital status: Married    Spouse name: [name]  . Number of children: 3  . Years of education: 16  . Highest education level: Bachelor's degree (e.g., BA, AB, BS)  Occupational History  . Occupation: woodworker    Comment: retired  . 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 Financial resource strain: Not on file  . Food insecurity:    Worry: Not on file  Inability: Not on file  . Transportation needs:    Medical: Not on file    Non-medical: Not on file  Tobacco Use  . Smoking status: Former Smoker    Years: 35.00    Types: Cigarettes    Last attempt to quit: 09/15/2002    Years since quitting: 15.9  . Smokeless tobacco: Never Used  Substance and Sexual Activity  . Alcohol use: Yes    Alcohol/week: 42.0 standard drinks    Types: 21 Cans of beer, 21 Shots of liquor per week    Comment: 6 drinks per day  . Drug use: No  . Sexual activity: Not Currently    Birth control/protection: None  Lifestyle  . Physical activity:    Days per week: Not on file    Minutes per session: Not on file  . Stress: Not on file  Relationships  . Social connections:      Talks on phone: Not on file    Gets together: Not on file    Attends religious service: Not on file    Active member of club or organization: Not on file    Attends meetings of clubs or organizations: Not on file    Relationship status: Not on file  Other Topics Concern  . Not on file  Social History Narrative   Lives with second wife [name], [place] woman   She travels a lot   Retired Armed forces technical officer   No reg exercise    Review of Systems: Gen: Denies fever, chills, anorexia. Denies fatigue, weakness, weight loss.  CV: Denies chest pain, palpitations, syncope, peripheral edema, and claudication. Resp: Denies dyspnea at rest, cough, wheezing, coughing up blood, and pleurisy. GI: see HPI  Derm: Denies rash, itching, dry skin Psych: Denies depression, anxiety, memory loss, confusion. No homicidal or suicidal ideation.  Heme: Denies bruising, bleeding, and enlarged lymph nodes.  Physical Exam: BP 137/87   Pulse 68   Temp (!) 97.1 F (36.2 C) (Oral)   Ht 5\' 6"  (1.676 m)   Wt 245 lb 6.4 oz (111.3 kg)   BMI 39.61 kg/m  General:   Alert and oriented. No distress noted. Pleasant and cooperative.  Head:  Normocephalic and atraumatic. Eyes:  Conjuctiva clear without scleral icterus. Mouth:  Oral mucosa pink and moist.  Abdomen:  +BS, soft, non-tender and non-distended. No rebound or guarding. No HSM or masses noted. Msk:  Symmetrical without gross deformities. Normal posture. Extremities:  Without edema. Neurologic:  Alert and  oriented x4 Psych:  Alert and cooperative. Normal mood and affect.

## 2018-08-03 NOTE — Patient Instructions (Signed)
Thank you for your patience today!  Continue Protonix once each morning, 30 minutes before breakfast.  Call us with an update on how the food allergy testing goes. We can add supportive medications in the future if needed; otherwise continue to avoid those triggers as you have been doing.  AWESOME job on the weight loss! Inspiring, and I know you are committed to the overall healthy lifestyle changes.  Good luck with the barn! :)    It was a pleasure to see you today. I strive to create trusting relationships with patients to provide genuine, compassionate, and quality care. I value your feedback. If you receive a survey regarding your visit,  I greatly appreciate you taking time to fill this out.   Annitta Needs, PhD, ANP-BC [facility]

## 2018-08-10 ENCOUNTER — Encounter: Payer: Self-pay | Admitting: Gastroenterology

## 2018-08-10 DIAGNOSIS — K219 Gastro-esophageal reflux disease without esophagitis: Secondary | ICD-10-CM | POA: Insufficient documentation

## 2018-08-10 NOTE — Assessment & Plan Note (Signed)
Negative colonoscopy. Improvement in stool frequency s/p avoidance of eggs. Will be going to see allergist soon and would like to hold off on any supportive measures until after this is completed. Continue to avoid triggers. Applauded on weight loss and to continue healthy diet/behavior changes.

## 2018-08-10 NOTE — Assessment & Plan Note (Signed)
- 

## 2018-08-11 NOTE — Progress Notes (Signed)
CC'ED TO PCP 

## 2018-10-28 ENCOUNTER — Ambulatory Visit (INDEPENDENT_AMBULATORY_CARE_PROVIDER_SITE_OTHER): Payer: Medicare Other | Admitting: Orthopedic Surgery

## 2018-10-28 ENCOUNTER — Encounter: Payer: Self-pay | Admitting: Orthopedic Surgery

## 2018-10-28 ENCOUNTER — Ambulatory Visit (INDEPENDENT_AMBULATORY_CARE_PROVIDER_SITE_OTHER): Payer: Medicare Other

## 2018-10-28 VITALS — BP 125/85 | HR 79 | Ht 66.0 in | Wt 240.0 lb

## 2018-10-28 DIAGNOSIS — M222X1 Patellofemoral disorders, right knee: Secondary | ICD-10-CM

## 2018-10-28 DIAGNOSIS — M222X2 Patellofemoral disorders, left knee: Secondary | ICD-10-CM | POA: Diagnosis not present

## 2018-10-28 DIAGNOSIS — M170 Bilateral primary osteoarthritis of knee: Secondary | ICD-10-CM

## 2018-10-28 DIAGNOSIS — M25561 Pain in right knee: Secondary | ICD-10-CM

## 2018-10-28 DIAGNOSIS — M25562 Pain in left knee: Secondary | ICD-10-CM

## 2018-10-28 DIAGNOSIS — G8929 Other chronic pain: Secondary | ICD-10-CM | POA: Diagnosis not present

## 2018-10-28 NOTE — Progress Notes (Signed)
NEW PATIENT OFFICE VISIT  Chief Complaint  Patient presents with  . 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  He indicates that his pain he had before arthroscopy is better but his overall knee pain in both knees has increased worse over the last 2 to 3 months not controlled with meloxicam which she has taken as prescribed.  He has pain which wakes him up at night and increased pain when he goes down the stairs.  He is lost 20 pounds with style modifications  Location both knees Duration several months Quality dull ache Severity increased pain wakes him up at night Associated with pain going down the steps   Review of Systems  Constitutional: Positive for weight loss. Negative for chills and fever.  Neurological: Negative for tingling.     Past Medical History:  Diagnosis Date  . Arthritis   . Sleep apnea     Past Surgical History:  Procedure Laterality Date  . BACK SURGERY     excision of melanoma  . BIOPSY  04/07/2018   Procedure: BIOPSY;  Surgeon: Danie Binder, MD;  Location: [facility];  Service: Endoscopy;;  random colon biopsy  . COLONOSCOPY WITH PROPOFOL N/A 04/07/2018   one 4 mm hyperplastic rectal polyp, redundant left colon, external and internal hemorrhoids  . ESOPHAGOGASTRODUODENOSCOPY (EGD) WITH PROPOFOL N/A 04/07/2018   Negative Barrett's. medium sized hiatal hernia. moderate gastritis/duodenitis  . KNEE ARTHROSCOPY WITH MEDIAL MENISECTOMY Left 10/02/2017   Procedure: LEFT KNEE ARTHROSCOPY WITH PARTIAL MEDIAL MENISECTOMY;  Surgeon: Carole Civil, MD;  Location: [facility];  Service: Orthopedics;  Laterality: Left;  . POLYPECTOMY  04/07/2018   Procedure: POLYPECTOMY;  Surgeon: Danie Binder, MD;  Location: [facility];  Service: Endoscopy;;  rectal  polyp cs    Family History  Problem Relation Age of Onset  . Cancer Mother        esophageal  . Cancer Sister        colon and breast   . Cancer Brother        colon  . Cancer Father 13       agressive lung cancer   Social History   Tobacco Use  . Smoking status: Former Smoker    Years: 35.00    Types: Cigarettes    Last attempt to quit: 09/15/2002    Years since quitting: 16.1  . Smokeless tobacco: Never Used  Substance Use Topics  . Alcohol use: Yes    Alcohol/week: 42.0 standard drinks    Types: 21 Cans of beer, 21 Shots of liquor per week    Comment: 6 drinks per day  . Drug use: No    No Known Allergies  Current Meds  Medication Sig  . meloxicam (MOBIC) 15 MG tablet Take 1 tablet (15 mg total) by mouth daily.  . pantoprazole (PROTONIX) 40 MG tablet 1 po 30 mins prior to first meal    BP 125/85   Pulse 79   Ht 5\' 6"  (1.676 m)   Wt 240 lb (108.9 kg)   BMI 38.74 kg/m   Physical Exam  Constitutional: He is oriented to person, place, and time. He appears well-developed and well-nourished.  Musculoskeletal:       Right knee: He exhibits no  effusion.       Left knee: He exhibits no effusion.  Neurological: He is alert and oriented to person, place, and time. Gait normal.  Psychiatric: He has a normal mood and affect. His behavior is normal. Judgment and thought content normal.    Right Knee Exam   Muscle Strength  The patient has normal right knee strength.  Tenderness  The patient is experiencing tenderness in the medial joint line (Also has tenderness in the peripatellar region with discomfort on palpation ).  Range of Motion  Extension: normal  Flexion: normal   Tests  McMurray:  Medial - negative Lateral - negative Varus: negative Valgus: negative Drawer:  Anterior - negative    Posterior - negative  Other  Erythema: absent Scars: absent Sensation: normal Pulse: present Swelling: none Effusion: no effusion present   Left Knee Exam    Muscle Strength  The patient has normal left knee strength.  Tenderness  The patient is experiencing tenderness in the medial joint line.  Range of Motion  Extension: normal  Flexion: normal   Tests  McMurray:  Medial - negative Lateral - negative Varus: negative Valgus: negative Drawer:  Anterior - negative     Posterior - negative  Other  Erythema: absent Scars: absent Sensation: normal Pulse: present Swelling: none Effusion: no effusion present        MEDICAL DECISION SECTION  Xrays were done at [facility] orthopedics My independent reading of xrays:  Varus on the left knee with medial joint space narrowing mild to moderate see report valgus alignment on the right knee with mild narrowing medial compartment see report  Encounter Diagnoses  Name Primary?  . Primary osteoarthritis of both knees   . Chronic pain of right knee   . Patellofemoral pain syndrome of both knees Yes    PLAN: (Rx., injectx, surgery, frx, mri/ct) Recommend patellofemoral stabilization exercises with home exercise program continue meloxicam follow-up in 3 months if no improvement we can consider injection  No orders of the defined types were placed in this encounter.   Arther Abbott, MD  10/28/2018 12:25 PM

## 2018-12-01 DIAGNOSIS — L570 Actinic keratosis: Secondary | ICD-10-CM | POA: Diagnosis not present

## 2018-12-01 DIAGNOSIS — Z85820 Personal history of malignant melanoma of skin: Secondary | ICD-10-CM | POA: Diagnosis not present

## 2018-12-01 DIAGNOSIS — L821 Other seborrheic keratosis: Secondary | ICD-10-CM | POA: Diagnosis not present

## 2018-12-01 DIAGNOSIS — L812 Freckles: Secondary | ICD-10-CM | POA: Diagnosis not present

## 2018-12-29 ENCOUNTER — Other Ambulatory Visit: Payer: Self-pay | Admitting: Orthopedic Surgery

## 2018-12-29 MED ORDER — MELOXICAM 15 MG PO TABS: 15.0000 mg | ORAL_TABLET | Freq: Every day | ORAL | 1 refills | Status: DC

## 2018-12-29 NOTE — Telephone Encounter (Signed)
Patient requests refill on Meloxicam (Mobic) 15 mgs.  Qty 90  Sig: Take 1 tablet (15 mg total) by mouth daily.  Patient states he uses Paediatric nurse

## 2019-01-27 ENCOUNTER — Ambulatory Visit (INDEPENDENT_AMBULATORY_CARE_PROVIDER_SITE_OTHER): Payer: Medicare Other | Admitting: Orthopedic Surgery

## 2019-01-27 ENCOUNTER — Other Ambulatory Visit: Payer: Self-pay

## 2019-01-27 ENCOUNTER — Encounter: Payer: Self-pay | Admitting: Orthopedic Surgery

## 2019-01-27 VITALS — BP 133/86 | HR 69 | Ht 66.0 in | Wt 232.0 lb

## 2019-01-27 DIAGNOSIS — M25561 Pain in right knee: Secondary | ICD-10-CM

## 2019-01-27 DIAGNOSIS — M1711 Unilateral primary osteoarthritis, right knee: Secondary | ICD-10-CM | POA: Diagnosis not present

## 2019-01-27 DIAGNOSIS — G8929 Other chronic pain: Secondary | ICD-10-CM

## 2019-01-27 NOTE — Progress Notes (Signed)
Progress Note   Patient ID: Peter Mora, male   DOB: 05/04/1946, 73 y.o.   MRN: [id number]   Chief Complaint  Patient presents with  . Knee Pain    right knee painful     73 year old male had a knee arthroscopy on the left he did well he still has some discomfort there  He comes in for his right knee which is painful previous x-rays showed osteoarthritis.  He says that he has intermittent pain in the right knee it is not constant it is activity related.  He did do a home exercise program but did not get any benefit from that.  After sitting for long periods of time he has a lot of stiffness and difficulty getting going again.  He complains of pain at night and when he moves his knee the wrong way.  He says it is getting worse he is having a lot of discomfort getting out of a chair.  He is considering knee replacement surgery wants to discuss that   Review of Systems  Constitutional: Negative for chills, fever and weight loss.  Respiratory: Negative for shortness of breath.   Cardiovascular: Negative for chest pain.  Neurological: Negative for tingling.  All other systems reviewed and are negative.  Current Meds  Medication Sig  . hydrocortisone cream 1 % Apply 1 application topically See admin instructions. Apply to scalp daily after shower  . meloxicam (MOBIC) 15 MG tablet Take 1 tablet (15 mg total) by mouth daily.  . pantoprazole (PROTONIX) 40 MG tablet 1 po 30 mins prior to first meal    Past Medical History:  Diagnosis Date  . Arthritis   . Sleep apnea      No Known Allergies   BP 133/86   Pulse 69   Ht 5\' 6"  (1.676 m)   Wt 232 lb (105.2 kg)   BMI 37.45 kg/m    Physical Exam General appearance normal Oriented x3 normal Mood pleasant affect normal  his gait seems reasonably normal   Ortho Exam Left knee 2 arthroscopy portals are seen Inspection and palpation revealed no abnormalities Range of motion is full No instability was detected on stress  testing Muscle tone and strength was normal without tremor Skin was warm dry and intact Good pulse and temperature were noted in the extremity Sensation revealed no abnormalities to light touch    Right knee tenderness over the medial and lateral joint line he seems to have mild varus There is no effusion Flexion is 120 he may have a slight flexion contracture All ligaments are stable Muscle tone and strength are normal Distal pulses are intact And he has no sensory deficit to light touch or pressure   MEDICAL DECISION MAKING   Imaging:  We have an x-ray from December 2019 he shows asymmetric joint space narrowing moderate medial worse than lateral with normal bone quality and minimal secondary bone abnormalities   Encounter Diagnoses  Name Primary?  . Chronic pain of right knee Yes  . Primary osteoarthritis of right knee      PLAN: (RX., injection, surgery,frx,mri/ct, XR 2 body ares) We discussed the possibility of knee replacement both versus knee arthroscopy.  He says he'd  rather get the definitive procedure done.  The procedure has been fully reviewed with the patient; The risks and benefits of surgery have been discussed and explained and understood. Alternative treatment has also been reviewed, questions were encouraged and answered. The postoperative plan is also been reviewed.  He  wants to think it over but thinks he really wants to have his right knee replaced  He will call us and then we will schedule right total knee in April probably around the 21st No orders of the defined types were placed in this encounter.  11:54 AM 01/27/2019

## 2019-01-27 NOTE — Patient Instructions (Signed)
Total Knee Replacement Total knee replacement is a procedure to replace the knee joint with an artificial (prosthetic) knee joint. The purpose of this surgery is to reduce knee pain and improve knee function. The prosthetic knee joint (prosthesis) may be made of metal, plastic or ceramic. It replaces parts of the thigh bone (femur), lower leg bone (tibia), and kneecap (patella) that are removed during the procedure. Tell a health care provider about:  Any allergies you have.  All medicines you are taking, including vitamins, herbs, eye drops, creams, and over-the-counter medicines.  Any problems you or family members have had with anesthetic medicines.  Any blood disorders you have.  Any surgeries you have had.  Any medical conditions you have.  Whether you are pregnant or may be pregnant. What are the risks? Generally, this is a safe procedure. However, problems may occur, including:  Infection.  Bleeding.  Allergic reactions to medicines.  Damage to other structures or organs.  Decreased range of motion of the knee.  Instability of the knee.  Loosening of the prosthetic joint.  Knee pain that does not go away (chronic pain). What happens before the procedure? Staying hydrated Follow instructions from your health care provider about hydration, which may include:  Up to 2 hours before the procedure - you may continue to drink clear liquids, such as water, clear fruit juice, black coffee, and plain tea.  Eating and drinking restrictions Follow instructions from your health care provider about eating and drinking, which may include:  8 hours before the procedure - stop eating heavy meals or foods such as meat, fried foods, or fatty foods.  6 hours before the procedure - stop eating light meals or foods, such as toast or cereal.  6 hours before the procedure - stop drinking milk or drinks that contain milk.  2 hours before the procedure - stop drinking clear liquids.  Medicines  Ask your health care provider about: ? Changing or stopping your regular medicines. This is especially important if you are taking diabetes medicines or blood thinners. ? Taking medicines such as aspirin and ibuprofen. These medicines can thin your blood. Do not take these medicines before your procedure if your health care provider instructs you not to.  You may be given antibiotic medicine to help prevent infection. General instructions  Have dental care and routine cleanings completed before your procedure. Plan to not have dental work done for 3 months after your procedure. Germs from anywhere in your body, including your mouth, can travel to your new joint and infect it.  Ask your health care provider how your surgical site will be marked or identified.  If your health care provider prescribes physical therapy, do exercises as instructed.  Do not use any tobacco products, such as cigarettes, chewing tobacco, or e-cigarettes. If you need help quitting, ask your health care provider.  You may have a physical exam.  You may have tests, such as: ? X-rays. ? MRI. ? CT scan. ? Bone scans.  You may have a blood or urine sample taken.  Plan to have someone take you home after the procedure.  If you will be going home right after the procedure, plan to have someone with you for at least 24 hours. It is recommended that you have someone to help care for you for at least 4-6 weeks after your procedure. What happens during the procedure?   To reduce your risk of infection: ? Your health care team will wash or sanitize their  hands. ? Your skin will be washed with soap.  An IV tube will be inserted into one of your veins.  You will be given one or more of the following: ? A medicine to help you relax (sedative). ? A medicine to numb the area (local anesthetic). ? A medicine to make you fall asleep (general anesthetic). ? A medicine that is injected into your spine to  numb the area below and slightly above the injection site (spinal anesthetic). ? A medicine that is injected into an area of your body to numb everything below the injection site (regional anesthetic).  An incision will be made in your knee.  Damaged cartilage and bone will be removed from your femur, tibia, and patella.  Parts of the prosthesis (liners) will be placed over the areas of bone and cartilage that were removed. A metal liner will be placed over your femur, and plastic liners will be placed over your tibia and the underside of your patella.  One or more small tubes (drains) may be placed near your incision to help drain extra fluid from your surgical site.  Your incision will be closed with stitches (sutures), skin glue, or adhesive strips. Medicine may be applied to your incision.  A bandage (dressing) will be placed over your incision. The procedure may vary among health care providers and hospitals. What happens after the procedure?  Your blood pressure, heart rate, breathing rate, and blood oxygen level will be monitored often until the medicines you were given have worn off.  You may continue to receive fluids and medicines through an IV tube.  You will have some pain. Pain medicines will be available to help you.  You may have fluid coming from one or more drains in your incision.  You may have to wear compression stockings. These stockings help to prevent blood clots and reduce swelling in your legs.  You will be encouraged to move around as much as possible.  You may be given a continuous passive motion machine to use at home. You will be shown how to use this machine.  Do not drive for 24 hours if you received a sedative. This information is not intended to replace advice given to you by your health care provider. Make sure you discuss any questions you have with your health care provider. Document Released: 02/10/2001 Document Revised: 04/24/2017 Document  Reviewed: 10/11/2015 Elsevier Interactive Patient Education  2019 [employer].

## 2019-01-28 ENCOUNTER — Telehealth: Payer: Self-pay | Admitting: Orthopedic Surgery

## 2019-01-28 NOTE — Telephone Encounter (Signed)
Marya Amsler called back today and stated that he spoke to his wife regarding knee replacement surgery.  He said they have a lot going on this summer so he has decided to wait on surgery for now.  He said they are thinking doing it possibly in September.  He said he will call back in August to schedule an appointment to come in and then go from there.  Thanks

## 2019-01-29 NOTE — Telephone Encounter (Signed)
Sounds good, thank you 

## 2019-04-19 DIAGNOSIS — H10013 Acute follicular conjunctivitis, bilateral: Secondary | ICD-10-CM | POA: Diagnosis not present

## 2019-04-19 DIAGNOSIS — T1512XA Foreign body in conjunctival sac, left eye, initial encounter: Secondary | ICD-10-CM | POA: Diagnosis not present

## 2019-05-24 ENCOUNTER — Other Ambulatory Visit: Payer: Self-pay | Admitting: Gastroenterology

## 2019-05-24 MED ORDER — PANTOPRAZOLE SODIUM 40 MG PO TBEC: DELAYED_RELEASE_TABLET | ORAL | 3 refills | Status: DC

## 2019-06-02 DIAGNOSIS — G478 Other sleep disorders: Secondary | ICD-10-CM | POA: Diagnosis not present

## 2019-06-02 DIAGNOSIS — R197 Diarrhea, unspecified: Secondary | ICD-10-CM | POA: Diagnosis not present

## 2019-06-02 DIAGNOSIS — M1990 Unspecified osteoarthritis, unspecified site: Secondary | ICD-10-CM | POA: Diagnosis not present

## 2019-06-02 DIAGNOSIS — K219 Gastro-esophageal reflux disease without esophagitis: Secondary | ICD-10-CM | POA: Diagnosis not present

## 2019-06-16 DIAGNOSIS — K219 Gastro-esophageal reflux disease without esophagitis: Secondary | ICD-10-CM | POA: Diagnosis not present

## 2019-06-16 DIAGNOSIS — M1990 Unspecified osteoarthritis, unspecified site: Secondary | ICD-10-CM | POA: Diagnosis not present

## 2019-06-16 DIAGNOSIS — H6122 Impacted cerumen, left ear: Secondary | ICD-10-CM | POA: Diagnosis not present

## 2019-06-16 DIAGNOSIS — R197 Diarrhea, unspecified: Secondary | ICD-10-CM | POA: Diagnosis not present

## 2019-06-16 DIAGNOSIS — Z0001 Encounter for general adult medical examination with abnormal findings: Secondary | ICD-10-CM | POA: Diagnosis not present

## 2019-06-16 DIAGNOSIS — E6609 Other obesity due to excess calories: Secondary | ICD-10-CM | POA: Diagnosis not present

## 2019-06-16 DIAGNOSIS — G478 Other sleep disorders: Secondary | ICD-10-CM | POA: Diagnosis not present

## 2019-06-18 DIAGNOSIS — Z712 Person consulting for explanation of examination or test findings: Secondary | ICD-10-CM | POA: Diagnosis not present

## 2019-06-18 DIAGNOSIS — R197 Diarrhea, unspecified: Secondary | ICD-10-CM | POA: Diagnosis not present

## 2019-06-22 ENCOUNTER — Other Ambulatory Visit: Payer: Self-pay | Admitting: Orthopedic Surgery

## 2019-06-22 MED ORDER — MELOXICAM 15 MG PO TABS: 15.0000 mg | ORAL_TABLET | Freq: Every day | ORAL | 1 refills | Status: DC

## 2019-06-22 NOTE — Telephone Encounter (Signed)
Patient called to request refill on medication: meloxicam (MOBIC) 15 MG tablet 90 tablet  - Peter Mora   - states "medication has been working great - knee feels good.  Please advise.

## 2019-06-29 DIAGNOSIS — R197 Diarrhea, unspecified: Secondary | ICD-10-CM | POA: Diagnosis not present

## 2019-06-29 DIAGNOSIS — K580 Irritable bowel syndrome with diarrhea: Secondary | ICD-10-CM | POA: Diagnosis not present

## 2019-07-20 DIAGNOSIS — L821 Other seborrheic keratosis: Secondary | ICD-10-CM | POA: Diagnosis not present

## 2019-07-20 DIAGNOSIS — Z85820 Personal history of malignant melanoma of skin: Secondary | ICD-10-CM | POA: Diagnosis not present

## 2019-07-20 DIAGNOSIS — L918 Other hypertrophic disorders of the skin: Secondary | ICD-10-CM | POA: Diagnosis not present

## 2019-07-20 DIAGNOSIS — D1801 Hemangioma of skin and subcutaneous tissue: Secondary | ICD-10-CM | POA: Diagnosis not present

## 2019-07-20 DIAGNOSIS — L570 Actinic keratosis: Secondary | ICD-10-CM | POA: Diagnosis not present

## 2019-07-20 DIAGNOSIS — L812 Freckles: Secondary | ICD-10-CM | POA: Diagnosis not present

## 2019-07-21 ENCOUNTER — Encounter: Payer: Self-pay | Admitting: Gastroenterology

## 2019-08-25 ENCOUNTER — Ambulatory Visit (INDEPENDENT_AMBULATORY_CARE_PROVIDER_SITE_OTHER): Payer: Medicare Other | Admitting: Gastroenterology

## 2019-08-25 ENCOUNTER — Encounter: Payer: Self-pay | Admitting: *Deleted

## 2019-08-25 ENCOUNTER — Encounter: Payer: Self-pay | Admitting: Gastroenterology

## 2019-08-25 ENCOUNTER — Other Ambulatory Visit: Payer: Self-pay | Admitting: *Deleted

## 2019-08-25 ENCOUNTER — Other Ambulatory Visit: Payer: Self-pay

## 2019-08-25 DIAGNOSIS — R197 Diarrhea, unspecified: Secondary | ICD-10-CM

## 2019-08-25 NOTE — Assessment & Plan Note (Signed)
SYMPTOMS NOT CONTROLLED.  FOLLOW A LACTOSE FREE DIET.  HANDOUT GIVEN. ADD LACTASE 3 PILLS WITH MEALS THREE TIMES A DAY. COMPLETE STOOL STUDIES. IF YOUR DIARRHEA DOES NOT IMPROVE, WE WILL COMPLETE  A  UPPER ENDOSCOPY TO BIOPSY YOUR SMALL BOWEL. FOLLOW UP IN 6 MOS.

## 2019-08-25 NOTE — Patient Instructions (Addendum)
FOLLOW A LACTOSE FREE DIET. SEE INFO BELOW.   ADD LACTASE 3 PILLS WITH MEALS THREE TIMES A DAY.  COMPLETE STOOL STUDIES.  IF YOUR DIARRHEA DOES NOT IMPROVE, WE WILL COMPLETE  A  UPPER ENDOSCOPY TO BIOPSY YOUR SMALL BOWEL. We will get you on the schedule today.    EAT TO LIVE AND THINK OF FOOD AS MEDICINE. 75% OF YOUR PLATE SHOULD BE FRUITS/VEGGIES.  To have more energy, and to lose weight:      1. CONTINUE YOUR WEIGHT LOSS EFFORTS. I RECOMMEND YOU READ AND FOLLOW RECOMMENDATIONS BY DR. MARK HYMAN, "10-DAY DETOX DIET".    2. If you must eat bread, EAT EZEKIEL BREAD. IT IS IN THE FROZEN SECTION OF THE GROCERY STORE.    3. DRINK WATER WITH FRUIT OR CUCUMBER ADDED. YOUR URINE SHOULD BE LIGHT YELLOW. AVOID SODA, GATORADE, ENERGY DRINKS, OR DIET SODA.     4. AVOID HIGH FRUCTOSE CORN SYRUP AND CAFFEINE.     5. DO NOT chew SUGAR FREE GUM OR USE ARTIFICIAL SWEETENERS. IF NEEDED USE STEVIA AS A SWEETENER.    6. DO NOT EAT ENRICHED WHEAT FLOUR, PASTA, RICE, OR CEREAL.    7. ONLY EAT WILD CAUGHT SEAFOOD, GRASS FED BEEF OR CHICKEN, PORK FROM PASTURE RAISE PIGS, OR EGGS FROM PASTURE RAISED CHICKENS.    8. START TAKING A MULTIVITAMIN, VITAMIN B12, AND VITAMIN D3 2000 IU DAILY.   ADDITIONAL SUPPLEMENTS TO DECREASE CRAVING AND SUPPRESS YOUR APPETITE:    1. CINNAMON 500 MG EVERY AM PRIOR TO FIRST MEAL.   2. CHROMIUM 400-500 MG WITH MEALS TWICE DAILY.   3. GREEN TEA EXTRACT ONE DAILY.   FOLLOW UP IN 4 MOS.

## 2019-08-25 NOTE — Progress Notes (Signed)
Subjective:    Patient ID: Peter Mora, male    DOB: [birth year], 73 y.o.   MRN: [id number]  Dr. Delphina Cahill  HPI Had trouble with diarrhea. Allergy test negative. STRONG ABX. RESET. GOING 6-8X IN THE MORNING(#5 AND USUALLY THE LAST ONE IS #7). Worse over the last year but now a little more solid. Rectal urgency improved: 30 mins to 1 hr notice. Only travelled to [place]. CUT DOWN ON ETOH AND ZERO IMPACT. Will stop eating 3-4 hrs before going to bed. Heartburn: never. DRINKS MILK AND EATS CHEESE MORE THAN HIS FAIR SHARE/NO ICE CREAM.  PT DENIES FEVER, CHILLS, HEMATOCHEZIA, HEMATEMESIS, nausea, vomiting, melena, CHEST PAIN, SHORTNESS OF BREATH, CHANGE IN BOWEL IN HABITS, constipation, abdominal pain, problems swallowing, problems with sedation, OR heartburn or indigestion.  Past Medical History:  Diagnosis Date  . Arthritis   . Sleep apnea    Past Surgical History:  Procedure Laterality Date  . BACK SURGERY     excision of melanoma  . BIOPSY  04/07/2018   Procedure: BIOPSY;  Surgeon: Danie Binder, MD;  [facility]: [facility];  Service: Endoscopy;;  random colon biopsy  . COLONOSCOPY WITH PROPOFOL N/A 04/07/2018   one 4 mm hyperplastic rectal polyp, redundant left colon, external and internal hemorrhoids  . ESOPHAGOGASTRODUODENOSCOPY (EGD) WITH PROPOFOL N/A 04/07/2018   Negative Barrett's. medium sized hiatal hernia. moderate gastritis/duodenitis  . KNEE ARTHROSCOPY WITH MEDIAL MENISECTOMY Left 10/02/2017   Procedure: LEFT KNEE ARTHROSCOPY WITH PARTIAL MEDIAL MENISECTOMY;  Surgeon: Carole Civil, MD;  Location: [facility];  Service: Orthopedics;  Laterality: Left;  . POLYPECTOMY  04/07/2018   Procedure: POLYPECTOMY;  Surgeon: Danie Binder, MD;  [facility];  Service: Endoscopy;;  rectal polyp cs    No Known Allergies  Current Outpatient Medications  Medication Sig    .      . meloxicam (MOBIC) 15 MG tablet Take 1 tablet (15 mg total) by mouth daily. Every day    . pantoprazole (PROTONIX) 40 MG tablet 1 po 30 mins prior to first meal Every day    Review of Systems PER HPI OTHERWISE ALL SYSTEMS ARE NEGATIVE.    Objective:   Physical Exam Vitals signs reviewed.  Constitutional:      General: He is not in acute distress.    Appearance: He is well-developed.  HENT:     Head: Normocephalic and atraumatic.     Mouth/Throat:     Comments: MASK IN PLACE Eyes:     General: No scleral icterus.    Pupils: Pupils are equal, round, and reactive to light.  Neck:     Musculoskeletal: Normal range of motion and neck supple.  Cardiovascular:     Rate and Rhythm: Normal rate and regular rhythm.     Heart sounds: Normal heart sounds.  Pulmonary:     Effort: Pulmonary effort is normal. No respiratory distress.     Breath sounds: Normal breath sounds.  Abdominal:     General: Bowel sounds are normal. There is no distension.     Palpations: Abdomen is soft.     Tenderness: There is no abdominal tenderness.  Musculoskeletal:     Right lower leg: No edema.     Left lower leg: No edema.  Lymphadenopathy:     Cervical: No cervical adenopathy.  Skin:    General: Skin is warm and dry.  Neurological:     Mental Status: He is alert and oriented to person, place, and time.  Comments: NO FOCAL DEFICITS  Psychiatric:        Mood and Affect: Mood normal.     Comments: NORMAL AFFECT       Assessment & Plan:

## 2019-08-26 NOTE — Progress Notes (Signed)
ON RECALL  °

## 2019-08-30 ENCOUNTER — Encounter: Payer: Self-pay | Admitting: *Deleted

## 2019-09-01 DIAGNOSIS — R197 Diarrhea, unspecified: Secondary | ICD-10-CM | POA: Diagnosis not present

## 2019-09-03 LAB — GIARDIA ANTIGEN
MICRO NUMBER:: 990450
RESULT:: NOT DETECTED
SPECIMEN QUALITY:: ADEQUATE

## 2019-09-03 LAB — FECAL LACTOFERRIN, QUANT
Fecal Lactoferrin: NEGATIVE
MICRO NUMBER:: 990384
SPECIMEN QUALITY:: ADEQUATE

## 2019-09-03 LAB — CLOSTRIDIUM DIFFICILE TOXIN B, QUALITATIVE, REAL-TIME PCR: Toxigenic C. Difficile by PCR: NOT DETECTED

## 2019-09-06 NOTE — Progress Notes (Signed)
CC'D TO PCP °

## 2019-09-20 DIAGNOSIS — Z23 Encounter for immunization: Secondary | ICD-10-CM | POA: Diagnosis not present

## 2019-09-24 ENCOUNTER — Telehealth: Payer: Self-pay | Admitting: Gastroenterology

## 2019-09-24 NOTE — Telephone Encounter (Signed)
Pt would like for SF to call him at her convience. [phone]

## 2019-10-02 NOTE — Telephone Encounter (Signed)
Called patient TO DISCUSS CONCERNS. DOING PRETTY GOOD. DOESN'T THINK NEEDS EGD IN MAC. WANTED TO ASK ABOUT SOMETHING ELSE.ALLERGIC REACTION TO GLUTEN SENSITIVITY NEGATIVE. FOLLOWING A LACTOSE FREE DIET AND LACTASE AND STOOLS BETTER.HAD BOTCHED TEST AT DR. HALL'S OFC. NOT A TEST FOR GUT. WANTS ANOTHER TEST BUT DOESN'T WANT TO PAY. BMs: NOT SOLID AND LESS FREQUENT. STILL IN MIDDLE OF NIGHT BUT NOT AS BAD. FEELS 2/3 THE WAY BACK TO NORMAL. WANTS ALLERGY TESTS TO HELP 100% RECOVERY.  DID GET SUPPLEMENTS. NOT GOING TO EAT FRUITS AND VEGETABLES BUT EATING HEALTHY.

## 2019-10-02 NOTE — Telephone Encounter (Signed)
WILL REACH OUT TO DR. HALL ON MON & TRY TO ASSIST WITH RESOLUTION OF HIS CONCERNS.

## 2019-11-18 ENCOUNTER — Other Ambulatory Visit: Payer: Self-pay

## 2019-11-18 ENCOUNTER — Encounter (HOSPITAL_COMMUNITY)
Admission: RE | Admit: 2019-11-18 | Discharge: 2019-11-18 | Disposition: A | Discharge status: Home / Self Care | Payer: Medicare Other | Source: Ambulatory Visit | Attending: Gastroenterology | Admitting: Gastroenterology

## 2019-11-22 ENCOUNTER — Other Ambulatory Visit: Payer: Self-pay

## 2019-11-22 ENCOUNTER — Other Ambulatory Visit (HOSPITAL_COMMUNITY): Payer: Medicare Other

## 2019-11-22 ENCOUNTER — Other Ambulatory Visit (HOSPITAL_COMMUNITY)
Admission: RE | Admit: 2019-11-22 | Discharge: 2019-11-22 | Disposition: A | Discharge status: Home / Self Care | Payer: Medicare Other | Source: Ambulatory Visit | Attending: Gastroenterology | Admitting: Gastroenterology

## 2019-11-22 DIAGNOSIS — Z20822 Contact with and (suspected) exposure to covid-19: Secondary | ICD-10-CM | POA: Insufficient documentation

## 2019-11-22 DIAGNOSIS — Z01812 Encounter for preprocedural laboratory examination: Secondary | ICD-10-CM | POA: Insufficient documentation

## 2019-11-22 LAB — SARS CORONAVIRUS 2 (TAT 6-24 HRS): SARS Coronavirus 2: NEGATIVE

## 2019-11-23 ENCOUNTER — Ambulatory Visit (HOSPITAL_COMMUNITY): Payer: Medicare Other | Admitting: Anesthesiology

## 2019-11-23 ENCOUNTER — Encounter (HOSPITAL_COMMUNITY): Payer: Self-pay | Admitting: Gastroenterology

## 2019-11-23 ENCOUNTER — Other Ambulatory Visit: Payer: Self-pay

## 2019-11-23 ENCOUNTER — Encounter (HOSPITAL_COMMUNITY)
Admission: RE | Disposition: A | Discharge status: Home / Self Care | Payer: Self-pay | Source: Home / Self Care | Attending: Gastroenterology

## 2019-11-23 ENCOUNTER — Ambulatory Visit (HOSPITAL_COMMUNITY)
Admission: RE | Admit: 2019-11-23 | Discharge: 2019-11-23 | Disposition: A | Discharge status: Home / Self Care | Payer: Medicare Other | Attending: Gastroenterology | Admitting: Gastroenterology

## 2019-11-23 DIAGNOSIS — R197 Diarrhea, unspecified: Secondary | ICD-10-CM | POA: Diagnosis not present

## 2019-11-23 DIAGNOSIS — K2970 Gastritis, unspecified, without bleeding: Secondary | ICD-10-CM

## 2019-11-23 DIAGNOSIS — K2980 Duodenitis without bleeding: Secondary | ICD-10-CM | POA: Insufficient documentation

## 2019-11-23 DIAGNOSIS — G4730 Sleep apnea, unspecified: Secondary | ICD-10-CM | POA: Diagnosis not present

## 2019-11-23 DIAGNOSIS — K219 Gastro-esophageal reflux disease without esophagitis: Secondary | ICD-10-CM | POA: Diagnosis not present

## 2019-11-23 DIAGNOSIS — M1990 Unspecified osteoarthritis, unspecified site: Secondary | ICD-10-CM | POA: Diagnosis not present

## 2019-11-23 DIAGNOSIS — Z79899 Other long term (current) drug therapy: Secondary | ICD-10-CM | POA: Diagnosis not present

## 2019-11-23 DIAGNOSIS — Z85820 Personal history of malignant melanoma of skin: Secondary | ICD-10-CM | POA: Diagnosis not present

## 2019-11-23 DIAGNOSIS — Z791 Long term (current) use of non-steroidal anti-inflammatories (NSAID): Secondary | ICD-10-CM | POA: Diagnosis not present

## 2019-11-23 DIAGNOSIS — Z87891 Personal history of nicotine dependence: Secondary | ICD-10-CM | POA: Insufficient documentation

## 2019-11-23 HISTORY — PX: ESOPHAGOGASTRODUODENOSCOPY (EGD) WITH PROPOFOL: SHX5813

## 2019-11-23 HISTORY — PX: BIOPSY: SHX5522

## 2019-11-23 SURGERY — ESOPHAGOGASTRODUODENOSCOPY (EGD) WITH PROPOFOL
Anesthesia: General

## 2019-11-23 MED ORDER — GLYCOPYRROLATE 0.2 MG/ML IJ SOLN
INTRAMUSCULAR | Status: DC | PRN
  Administered 2019-11-23: .2 mg via INTRAVENOUS

## 2019-11-23 MED ORDER — HYDROCODONE-ACETAMINOPHEN 7.5-325 MG PO TABS: 1.0000 | ORAL_TABLET | Freq: Once | ORAL | Status: DC | PRN

## 2019-11-23 MED ORDER — DICYCLOMINE HCL 10 MG PO CAPS: ORAL_CAPSULE | ORAL | 11 refills | Status: DC

## 2019-11-23 MED ORDER — LIDOCAINE VISCOUS HCL 2 % MT SOLN: 5.0000 mL | Freq: Once | OROMUCOSAL | Status: DC

## 2019-11-23 MED ORDER — LIDOCAINE VISCOUS HCL 2 % MT SOLN
OROMUCOSAL | Status: AC
  Filled 2019-11-23: qty 15

## 2019-11-23 MED ORDER — STERILE WATER FOR IRRIGATION IR SOLN
Status: DC | PRN
  Administered 2019-11-23: 100 mL

## 2019-11-23 MED ORDER — ONDANSETRON HCL 4 MG/2ML IJ SOLN
INTRAMUSCULAR | Status: AC
  Filled 2019-11-23: qty 4

## 2019-11-23 MED ORDER — PROPOFOL 10 MG/ML IV BOLUS
INTRAVENOUS | Status: AC
  Filled 2019-11-23: qty 40

## 2019-11-23 MED ORDER — HYDROMORPHONE HCL 1 MG/ML IJ SOLN: 0.2500 mg | INTRAMUSCULAR | Status: DC | PRN

## 2019-11-23 MED ORDER — KETAMINE HCL 50 MG/5ML IJ SOSY
PREFILLED_SYRINGE | INTRAMUSCULAR | Status: AC
  Filled 2019-11-23: qty 5

## 2019-11-23 MED ORDER — PROPOFOL 10 MG/ML IV BOLUS
INTRAVENOUS | Status: DC | PRN
  Administered 2019-11-23: 30 mg via INTRAVENOUS

## 2019-11-23 MED ORDER — LACTATED RINGERS IV SOLN: INTRAVENOUS | Status: DC

## 2019-11-23 MED ORDER — KETAMINE HCL 10 MG/ML IJ SOLN
INTRAMUSCULAR | Status: DC | PRN
  Administered 2019-11-23: 30 mg via INTRAVENOUS

## 2019-11-23 MED ORDER — LIDOCAINE HCL (CARDIAC) PF 100 MG/5ML IV SOSY
PREFILLED_SYRINGE | INTRAVENOUS | Status: DC | PRN
  Administered 2019-11-23: 60 mg via INTRAVENOUS

## 2019-11-23 MED ORDER — PROMETHAZINE HCL 25 MG/ML IJ SOLN: 6.2500 mg | INTRAMUSCULAR | Status: DC | PRN

## 2019-11-23 MED ORDER — MIDAZOLAM HCL 2 MG/2ML IJ SOLN: 0.5000 mg | Freq: Once | INTRAMUSCULAR | Status: DC | PRN

## 2019-11-23 MED ORDER — PROPOFOL 500 MG/50ML IV EMUL
INTRAVENOUS | Status: DC | PRN
  Administered 2019-11-23: 200 ug/kg/min via INTRAVENOUS
  Administered 2019-11-23: 175 ug/kg/min via INTRAVENOUS

## 2019-11-23 NOTE — Anesthesia Postprocedure Evaluation (Signed)
Anesthesia Post Note  Patient: Peter Mora  Procedure(s) Performed: ESOPHAGOGASTRODUODENOSCOPY (EGD) WITH PROPOFOL (N/A ) BIOPSY  Patient location during evaluation: PACU Anesthesia Type: General Level of consciousness: awake and alert and oriented Pain management: pain level controlled Vital Signs Assessment: post-procedure vital signs reviewed and stable Respiratory status: spontaneous breathing Cardiovascular status: stable Postop Assessment: no apparent nausea or vomiting Anesthetic complications: no     Last Vitals:  Vitals:   11/23/19 1027 11/23/19 1140  BP: (!) 137/93 (P) 98/65  Pulse: 91 (P) 97  Resp: 14 (!) (P) 25  Temp: 36.6 C (!) (P) 36.2 C  SpO2:  (P) 96%    Last Pain:  Vitals:   11/23/19 1135  TempSrc:   PainSc: 0-No pain                 Gabriela Irigoyen A

## 2019-11-23 NOTE — Transfer of Care (Signed)
Immediate Anesthesia Transfer of Care Note  Patient: Peter Mora  Procedure(s) Performed: ESOPHAGOGASTRODUODENOSCOPY (EGD) WITH PROPOFOL (N/A ) BIOPSY  Patient Location: PACU  Anesthesia Type:General  Level of Consciousness: awake, alert , oriented and patient cooperative  Airway & Oxygen Therapy: Patient Spontanous Breathing  Post-op Assessment: Report given to RN and Post -op Vital signs reviewed and stable  Post vital signs: Reviewed and stable  Last Vitals:  Vitals Value Taken Time  BP 98/65 11/23/19 1142  Temp    Pulse 96 11/23/19 1143  Resp 22 11/23/19 1143  SpO2 93 % 11/23/19 1143  Vitals shown include unvalidated device data.  Last Pain:  Vitals:   11/23/19 1135  TempSrc:   PainSc: 0-No pain         Complications: No apparent anesthesia complications

## 2019-11-23 NOTE — Discharge Instructions (Signed)
You have mild gastritis. YOUR SMALL BOWEL LOOKED NORMAL. I biopsied your small bowel.   DRINK WATER TO KEEP YOUR URINE LIGHT YELLOW.  CONTINUE YOUR WEIGHT LOSS EFFORTS.  FOLLOW A LACTOSE FREE DIET. SEE INFO BELOW.   TO REDUCE LOOSE STOOLS, START BENTYL 10 MG AT BEDTIME AND IF NEEDED 30 MINUTES PRIOR TO BREAKFAST . IT MAY CAUSE DRY MOUTH/EYES, DROWSINESS, DIFFICULTY URINATING, OR BLURRY VISION.  CONTINUE PANTOPRAZOLE.  TAKE 30 MINUTES PRIOR TO YOUR FIRST MEAL EVERY DAY.  CONTINUE 2-3 LACTASE PILLS WITH MEALS UP TO THREE TIMES A DAY.   YOUR BIOPSY RESULTS WILL BE BACK IN 5 BUSINESS DAYS.  FOLLOW UP IN 4 MOS.  UPPER ENDOSCOPY AFTER CARE Read the instructions outlined below and refer to this sheet in the next week. These discharge instructions provide you with general information on caring for yourself after you leave the hospital. While your treatment has been planned according to the most current medical practices available, unavoidable complications occasionally occur. If you have any problems or questions after discharge, call DR. FIELDS, [phone].  ACTIVITY  You may resume your regular activity, but move at a slower pace for the next 24 hours.   Take frequent rest periods for the next 24 hours.   Walking will help get rid of the air and reduce the bloated feeling in your belly (abdomen).   No driving for 24 hours (because of the medicine (anesthesia) used during the test).   You may shower.   Do not sign any important legal documents or operate any machinery for 24 hours (because of the anesthesia used during the test).    NUTRITION  Drink plenty of fluids.   You may resume your normal diet as instructed by your doctor.   Begin with a light meal and progress to your normal diet. Heavy or fried foods are harder to digest and may make you feel sick to your stomach (nauseated).   Avoid alcoholic beverages for 24 hours or as instructed.    MEDICATIONS  You may  resume your normal medications.   WHAT YOU CAN EXPECT TODAY  Some feelings of bloating in the abdomen.   Passage of more gas than usual.    IF YOU HAD A BIOPSY TAKEN DURING THE UPPER ENDOSCOPY:  Eat a soft diet IF YOU HAVE NAUSEA, BLOATING, ABDOMINAL PAIN, OR VOMITING.    FINDING OUT THE RESULTS OF YOUR TEST Not all test results are available during your visit. DR. Oneida Alar WILL CALL YOU WITHIN 14 DAYS OF YOUR PROCEDUE WITH YOUR RESULTS. Do not assume everything is normal if you have not heard from DR. FIELDS, CALL HER OFFICE AT [phone].  SEEK IMMEDIATE MEDICAL ATTENTION AND CALL THE OFFICE: [phone] IF:  You have more than a spotting of blood in your stool.   Your belly is swollen (abdominal distention).   You are nauseated or vomiting.   You have a temperature over 101F.   You have abdominal pain or discomfort that is severe or gets worse throughout the day.    Lactose Free Diet Lactose is a carbohydrate that is found mainly in milk and milk products, as well as in foods with added milk or whey. Lactose must be digested by the enzyme in order to be used by the body. Lactose intolerance occurs when there is a shortage of lactase. When your body is not able to digest lactose, you may feel sick to your stomach (nausea), bloating, cramping, gas and diarrhea.  There are many dairy  products that may be tolerated better than milk by some people:  The use of cultured dairy products such as yogurt, buttermilk, cottage cheese, and sweet acidophilus milk (Kefir) for lactase-deficient individuals is usually well tolerated. This is because the healthy bacteria help digest lactose.   Lactose-hydrolyzed milk (Lactaid) contains 40-90% less lactose than milk and may also be well tolerated.    SPECIAL NOTES  Lactose is a carbohydrates. The major food source is dairy products. Reading food labels is important. Many products contain lactose even when they are not made from milk.  Look for the following words: whey, milk solids, dry milk solids, nonfat dry milk powder. Typical sources of lactose other than dairy products include breads, candies, cold cuts, prepared and processed foods, and commercial sauces and gravies.   All foods must be prepared without milk, cream, or other dairy foods.   Soy milk and lactose-free supplements (LACTASE) may be used as an alternative to milk.   FOOD GROUP ALLOWED/RECOMMENDED AVOID/USE SPARINGLY  BREADS / STARCHES 4 servings or more* Breads and rolls made without milk. [place], [place], or [place] bread. Breads and rolls that contain milk. Prepared mixes such as muffins, biscuits, waffles, pancakes. Sweet rolls, donuts, [place] toast (if made with milk or lactose).  Crackers: Soda crackers, graham crackers. Any crackers prepared without lactose. Zwieback crackers, corn curls, or any that contain lactose.  Cereals: Cooked or dry cereals prepared without lactose (read labels). Cooked or dry cereals prepared with lactose (read labels). Total, Cocoa Krispies. Special K.  Potatoes / Pasta / Rice: Any prepared without milk or lactose. Popcorn. Instant potatoes, frozen [place] fries, scalloped or au gratin potatoes.  VEGETABLES 2 servings or more Fresh, frozen, and canned vegetables. Creamed or breaded vegetables. Vegetables in a cheese sauce or with lactose-containing margarines.  FRUIT 2 servings or more All fresh, canned, or frozen fruits that are not processed with lactose. Any canned or frozen fruits processed with lactose.  MEAT & SUBSTITUTES 2 servings or more (4 to 6 oz. total per day) Plain beef, chicken, fish, [place], lamb, veal, pork, or ham. Kosher prepared meat products. Strained or junior meats that do not contain milk. Eggs, soy meat substitutes, nuts. Scrambled eggs, omelets, and souffles that contain milk. Creamed or breaded meat, fish, or fowl. Sausage products such as wieners, liver sausage, or cold cuts that contain milk solids.  Cheese, cottage cheese, or cheese spreads.  MILK None. (See "BEVERAGES" for milk substitutes. See "DESSERTS" for ice cream and frozen desserts.) Milk (whole, 2%, skim, or chocolate). Evaporated, powdered, or condensed milk; malted milk.  SOUPS & COMBINATION FOODS Bouillon, broth, vegetable soups, clear soups, consomms. Homemade soups made with allowed ingredients. Combination or prepared foods that do not contain milk or milk products (read labels). Cream soups, chowders, commercially prepared soups containing lactose. Macaroni and cheese, pizza. Combination or prepared foods that contain milk or milk products.  DESSERTS & SWEETS In moderation Water and fruit ices; gelatin; angel food cake. Homemade cookies, pies, or cakes made from allowed ingredients. Pudding (if made with water or a milk substitute). Lactose-free tofu desserts. Sugar, honey, corn syrup, jam, jelly; marmalade; molasses (beet sugar); Pure sugar candy; marshmallows. Ice cream, ice milk, sherbet, custard, pudding, frozen yogurt. Commercial cake and cookie mixes. Desserts that contain chocolate. Pie crust made with milk-containing margarine; reduced-calorie desserts made with a sugar substitute that contains lactose. Toffee, peppermint, butterscotch, chocolate, caramels.  FATS & OILS In moderation Butter (as tolerated; contains very small amounts of lactose). Margarines and  dressings that do not contain milk, Vegetable oils, shortening, Miracle Whip, mayonnaise, nondairy cream & whipped toppings without lactose or milk solids added (examples: Coffee Rich, Carnation Coffeemate, Rich's Whipped Topping, PolyRich). Berniece Salines. Margarines and salad dressings containing milk; cream, cream cheese; peanut butter with added milk solids, sour cream, chip dips, made with sour cream.  BEVERAGES Carbonated drinks; tea; coffee and freeze-dried coffee; some instant coffees (check labels). Fruit drinks; fruit and vegetable juice; Rice or Soy milk. Ovaltine,  hot chocolate. Some cocoas; some instant coffees; instant iced teas; powdered fruit drinks (read labels).   CONDIMENTS / MISCELLANEOUS Soy sauce, carob powder, olives, gravy made with water, baker's cocoa, pickles, pure seasonings and spices, wine, pure monosodium glutamate, catsup, mustard. Some chewing gums, chocolate, some cocoas. Certain antibiotics and vitamin / mineral preparations. Spice blends if they contain milk products. MSG extender. Artificial sweeteners that contain lactose such as Equal (Nutra-Sweet) and Sweet 'n Low. Some nondairy creamers (read labels).

## 2019-11-23 NOTE — Anesthesia Procedure Notes (Signed)
Procedure Name: General with mask airway Date/Time: 11/23/2019 11:18 AM Performed by: Andree Elk Alvin Rubano A, CRNA Pre-anesthesia Checklist: Patient identified, Emergency Drugs available, Suction available, Timeout performed and Patient being monitored Patient Re-evaluated:Patient Re-evaluated prior to induction Oxygen Delivery Method: Non-rebreather mask

## 2019-11-23 NOTE — Anesthesia Preprocedure Evaluation (Signed)
Anesthesia Evaluation  Patient identified by MRN, date of birth, ID band Patient awake    Reviewed: Allergy & Precautions, NPO status , Patient's Chart, lab work & pertinent test results  Airway Mallampati: I  TM Distance: >3 FB Neck ROM: Full    Dental no notable dental hx. (+) Teeth Intact   Pulmonary sleep apnea , former smoker,  States doesn't have or use CPAP Does report OSA   Pulmonary exam normal breath sounds clear to auscultation       Cardiovascular Exercise Tolerance: Good negative cardio ROS Normal cardiovascular examI Rhythm:Regular Rate:Normal     Neuro/Psych negative neurological ROS  negative psych ROS   GI/Hepatic Neg liver ROS, GERD  Medicated and Controlled,  Endo/Other  negative endocrine ROS  Renal/GU negative Renal ROS  negative genitourinary   Musculoskeletal  (+) Arthritis , Osteoarthritis,    Abdominal   Peds negative pediatric ROS (+)  Hematology negative hematology ROS (+)   Anesthesia Other Findings   Reproductive/Obstetrics negative OB ROS                             Anesthesia Physical Anesthesia Plan  ASA: II  Anesthesia Plan: General   Post-op Pain Management:    Induction: Intravenous  PONV Risk Score and Plan: 2 and Propofol infusion and TIVA  Airway Management Planned: Nasal Cannula and Simple Face Mask  Additional Equipment:   Intra-op Plan:   Post-operative Plan:   Informed Consent: I have reviewed the patients History and Physical, chart, labs and discussed the procedure including the risks, benefits and alternatives for the proposed anesthesia with the patient or authorized representative who has indicated his/her understanding and acceptance.     Dental advisory given  Plan Discussed with: CRNA  Anesthesia Plan Comments: (Plan Full PPE use  Plan GA with GETA as needed d/w pt -WTP with same after Q&A)        Anesthesia  Quick Evaluation

## 2019-11-23 NOTE — Op Note (Signed)
[facility] Patient Name: Peter Mora Procedure Date: 11/23/2019 11:15 AM MRN: [id number] Date of Birth: 07/23/1946 Attending MD: Barney Drain MD, MD CSN: [id number] Age: 74 Admit Type: Outpatient Procedure:                Upper GI endoscopy WITH COLD FORCEPS BIOPSY Indications:              Diarrhea: 5-6X IN AM AND ASSOCIATED WITH NOCTURNAL                            STOOLS. IMPROVED AFTER LACTOSE FREE DIET.                            OCCASIONALLY HAVING NL FORMED STOOL. Providers:                Barney Drain MD, MD, Charlsie Quest. Theda Sers RN, RN,                            Rosina Lowenstein, RN Referring MD:             Edwinna Areola. Hall MD Medicines:                Propofol per Anesthesia Complications:            No immediate complications. Estimated Blood Loss:     Estimated blood loss was minimal. Procedure:                Pre-Anesthesia Assessment:                           - Prior to the procedure, a History and Physical                            was performed, and patient medications and                            allergies were reviewed. The patient's tolerance of                            previous anesthesia was also reviewed. The risks                            and benefits of the procedure and the sedation                            options and risks were discussed with the patient.                            All questions were answered, and informed consent                            was obtained. Prior Anticoagulants: The patient has                            taken no previous anticoagulant or antiplatelet  agents except for NSAID medication. ASA Grade                            Assessment: II - A patient with mild systemic                            disease. After reviewing the risks and benefits,                            the patient was deemed in satisfactory condition to                            undergo the procedure. After obtaining  informed                            consent, the endoscope was passed under direct                            vision. Throughout the procedure, the patient's                            blood pressure, pulse, and oxygen saturations were                            monitored continuously. The [id number] ID:[id number])                            scope was introduced through the mouth, and                            advanced to the duodenal bulb. The upper GI                            endoscopy was accomplished without difficulty. The                            patient tolerated the procedure well. Scope In: 11:24:52 AM Scope Out: 11:33:51 AM Total Procedure Duration: 0 hours 8 minutes 59 seconds  Findings:      The examined esophagus was normal.      Localized mild inflammation characterized by congestion (edema) and       erythema was found in the gastric antrum.      The duodenal bulb was normal. Biopsies for histology were taken with a       cold forceps for evaluation of celiac disease.      The second portion of the duodenum was normal. Biopsies for histology       were taken with a cold forceps for evaluation of celiac disease. Impression:               - NO OBVIOUS SOURCE FOR DIARRHEA WAS IDENTIFIED.                           - MILD Gastritis. Moderate Sedation:      Per Anesthesia Care Recommendation:           -  Patient has a contact number available for                            emergencies. The signs and symptoms of potential                            delayed complications were discussed with the                            patient. Return to normal activities tomorrow.                            Written discharge instructions were provided to the                            patient.                           - Lactose free diet.                           - Continue present medications.                           - Await pathology results.                           - Return to GI  office in 4 months. Procedure Code(s):        --- Professional ---                           [phone], Esophagogastroduodenoscopy, flexible,                            transoral; with biopsy, single or multiple Diagnosis Code(s):        --- Professional ---                           K29.70, Gastritis, unspecified, without bleeding                           R19.7, Diarrhea, unspecified CPT copyright 2019 American Medical Association. All rights reserved. The codes documented in this report are preliminary and upon coder review may  be revised to meet current compliance requirements. Barney Drain, MD Barney Drain MD, MD 11/23/2019 11:53:24 AM This report has been signed electronically. Number of Addenda: 0

## 2019-11-23 NOTE — H&P (Signed)
Primary Care Physician:  Celene Squibb, MD Primary Gastroenterologist:  Dr. Oneida Alar  Pre-Procedure History & Physical: HPI:  Peter Mora is a 74 y.o. male here for DIARRHEA.  Past Medical History:  Diagnosis Date  . 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 Sleep apnea     Past Surgical History:  Procedure Laterality Date  . BACK SURGERY     excision of melanoma  . BIOPSY  04/07/2018   Procedure: BIOPSY;  Surgeon: Danie Binder, MD;  Location: [facility];  Service: Endoscopy;;  random colon biopsy  . COLONOSCOPY WITH PROPOFOL N/A 04/07/2018   one 4 mm hyperplastic rectal polyp, redundant left colon, external and internal hemorrhoids  . ESOPHAGOGASTRODUODENOSCOPY (EGD) WITH PROPOFOL N/A 04/07/2018   Negative Barrett's. medium sized hiatal hernia. moderate gastritis/duodenitis  . KNEE ARTHROSCOPY WITH MEDIAL MENISECTOMY Left 10/02/2017   Procedure: LEFT KNEE ARTHROSCOPY WITH PARTIAL MEDIAL MENISECTOMY;  Surgeon: Carole Civil, MD;  Location: [facility];  Service: Orthopedics;  Laterality: Left;  . POLYPECTOMY  04/07/2018   Procedure: POLYPECTOMY;  Surgeon: Danie Binder, MD;  Location: [facility];  Service: Endoscopy;;  rectal polyp cs    Prior to Admission medications   Medication Sig Start Date End Date Taking? Authorizing Provider  cholecalciferol (VITAMIN D3) 25 MCG (1000 UT) tablet Take 1,000 Units by mouth daily.   Yes [provider]  meloxicam (MOBIC) 15 MG tablet Take 1 tablet (15 mg total) by mouth daily. 06/22/19  Yes Carole Civil, MD  Multiple Vitamin (MULTIVITAMIN WITH MINERALS) TABS tablet Take 1 tablet by mouth daily.   Yes [provider]  pantoprazole (PROTONIX) 40 MG tablet 1 po 30 mins prior to first meal Patient taking differently: Take 40 mg by mouth daily before breakfast.  05/24/19  Yes Annitta Needs, NP  vitamin B-12 (CYANOCOBALAMIN) 1000 MCG tablet Take 1,000 mcg by mouth daily.   Yes [provider]    Allergies as of  08/25/2019  . (No Known Allergies)    Family History  Problem Relation Age of Onset  . Cancer Mother        esophageal  . Cancer Sister        colon and breast   . Cancer Brother        colon  . Cancer Father 74       agressive lung cancer    Social History   Socioeconomic History  . Marital status: Single    Spouse name: [name]  . Number of children: 3  . Years of education: 16  . Highest education level: Bachelor's degree (e.g., BA, AB, BS)  Occupational History  . Occupation: woodworker    Comment: retired  . 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 Smoking status: Former Smoker    Years: 35.00    Types: Cigarettes    Quit date: 09/15/2002    Years since quitting: 17.2  . Smokeless tobacco: Never Used  Substance and Sexual Activity  . Alcohol use: Yes    Alcohol/week: 42.0 standard drinks    Types: 21 Cans of beer, 21 Shots of liquor per week    Comment: 6 drinks per day  . Drug use: No  . Sexual activity: Not Currently    Birth control/protection: None  Other Topics Concern  . Not on file  Social History Narrative   Lives with second wife [name], [place] woman   She travels a lot   Retired Armed forces technical officer  No reg exercise   Social Determinants of Health   Financial Resource Strain:   . Difficulty of Paying Living Expenses: Not on file  Food Insecurity:   . Worried About Charity fundraiser in the Last Year: Not on file  . Ran Out of Food in the Last Year: Not on file  Transportation Needs:   . Lack of Transportation (Medical): Not on file  . Lack of Transportation (Non-Medical): Not on file  Physical Activity:   . Days of Exercise per Week: Not on file  . Minutes of Exercise per Session: Not on file  Stress:   . Feeling of Stress : Not on file  Social Connections:   . Frequency of Communication with Friends and Family: Not on file  . Frequency of Social Gatherings with Friends and Family: Not on file  . Attends Religious  Services: Not on file  . Active Member of Clubs or Organizations: Not on file  . Attends Archivist Meetings: Not on file  . Marital Status: Not on file  Intimate Partner Violence:   . Fear of Current or Ex-Partner: Not on file  . Emotionally Abused: Not on file  . Physically Abused: Not on file  . Sexually Abused: Not on file    Review of Systems: See HPI, otherwise negative ROS   Physical Exam: BP (!) 137/93   Pulse 91   Temp 97.9 F (36.6 C) (Oral)   Resp 14  General:   Alert,  pleasant and cooperative in NAD Head:  Normocephalic and atraumatic. Neck:  Supple; Lungs:  Clear throughout to auscultation.    Heart:  Regular rate and rhythm. Abdomen:  Soft, nontender and nondistended. Normal bowel sounds, without guarding, and without rebound.   Neurologic:  Alert and  oriented x4;  grossly normal neurologically.  Impression/Plan:     Diarrhea  PLAN: EGD TODAY WITH BIOPSY. DISCUSSED PROCEDURE, BENEFITS, & RISKS: < 1% chance of medication reaction, bleeding, perforation, or ASPIRATION.

## 2019-11-24 LAB — SURGICAL PATHOLOGY

## 2019-11-25 ENCOUNTER — Telehealth: Payer: Self-pay | Admitting: Gastroenterology

## 2019-11-25 NOTE — Telephone Encounter (Signed)
Returning call to AS. [phone]

## 2019-11-25 NOTE — Progress Notes (Signed)
Lmom for pt to call us back. 

## 2019-11-25 NOTE — Telephone Encounter (Addendum)
Called pt back and made him aware of results.  Pt is aware that I made him an appt with [facility] for May in error.  I informed him that [facility]'s May schedules haven't been released yet.  However, we will call him back once available.  Pt voiced understanding.

## 2019-11-29 ENCOUNTER — Ambulatory Visit (INDEPENDENT_AMBULATORY_CARE_PROVIDER_SITE_OTHER): Payer: Medicare Other | Admitting: Cardiovascular Disease

## 2019-11-29 ENCOUNTER — Encounter: Payer: Self-pay | Admitting: Cardiovascular Disease

## 2019-11-29 ENCOUNTER — Other Ambulatory Visit: Payer: Self-pay

## 2019-11-29 VITALS — BP 118/68 | HR 78 | Ht 66.0 in | Wt 241.0 lb

## 2019-11-29 DIAGNOSIS — I493 Ventricular premature depolarization: Secondary | ICD-10-CM | POA: Diagnosis not present

## 2019-11-29 NOTE — Patient Instructions (Signed)
Medication Instructions:   Your physician recommends that you continue on your current medications as directed. Please refer to the Current Medication list given to you today.  Labwork:  NONE  Testing/Procedures:  NONE  Follow-Up:  Your physician recommends that you schedule a follow-up appointment in: as needed.   Any Other Special Instructions Will Be Listed Below (If Applicable).  If you need a refill on your cardiac medications before your next appointment, please call your pharmacy. 

## 2019-11-29 NOTE — Progress Notes (Signed)
SUBJECTIVE: The patient presents for the evaluation of an abnormal EKG.  I personally viewed EKG performed at the time of EGD on 11/24/2019 which demonstrated sinus rhythm with PVCs.  Echocardiogram November 2018 demonstrated normal left ventricular systolic and diastolic function and normal regional wall motion, LVEF 60 to 65%.  The patient denies any symptoms of chest pain, palpitations, shortness of breath, lightheadedness, dizziness, leg swelling, orthopnea, PND, and syncope.     Social history: He is married and has 4 children.  His eldest son went to the [employer] on a full scholarship and is an Chief Financial Officer.  The patient lived in [place], [place], for about 27 years.  He has been living in [place] for 15 years.  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  Review of Systems: As per "subjective", otherwise negative.  No Known Allergies  Current Outpatient Medications  Medication Sig Dispense Refill  . cholecalciferol (VITAMIN D3) 25 MCG (1000 UT) tablet Take 1,000 Units by mouth daily.    . meloxicam (MOBIC) 15 MG tablet Take 1 tablet (15 mg total) by mouth daily. 90 tablet 1  . Multiple Vitamin (MULTIVITAMIN WITH MINERALS) TABS tablet Take 1 tablet by mouth daily.    . pantoprazole (PROTONIX) 40 MG tablet 1 po 30 mins prior to first meal (Patient taking differently: Take 40 mg by mouth daily before breakfast. ) 90 tablet 3  . vitamin B-12 (CYANOCOBALAMIN) 1000 MCG tablet Take 1,000 mcg by mouth daily.     No current facility-administered medications for this visit.    Past Medical History:  Diagnosis Date  . 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 Sleep apnea     Past Surgical History:  Procedure Laterality Date  . BACK SURGERY     excision of melanoma  . BIOPSY  04/07/2018   Procedure: BIOPSY;  Surgeon: Danie Binder, MD;  [facility];  Service: Endoscopy;;  random colon biopsy  . BIOPSY  11/23/2019   Procedure: BIOPSY;   Surgeon: Danie Binder, MD;  [facility];  Service: Endoscopy;;  duodenal bulb, second portion of duodenal , duodenal nodule   . COLONOSCOPY WITH PROPOFOL N/A 04/07/2018   one 4 mm hyperplastic rectal polyp, redundant left colon, external and internal hemorrhoids  . ESOPHAGOGASTRODUODENOSCOPY (EGD) WITH PROPOFOL N/A 04/07/2018   Negative Barrett's. medium sized hiatal hernia. moderate gastritis/duodenitis  . ESOPHAGOGASTRODUODENOSCOPY (EGD) WITH PROPOFOL N/A 11/23/2019   Procedure: ESOPHAGOGASTRODUODENOSCOPY (EGD) WITH PROPOFOL;  Surgeon: Danie Binder, MD;  [facility];  Service: Endoscopy;  Laterality: N/A;  11:15am  . KNEE ARTHROSCOPY WITH MEDIAL MENISECTOMY Left 10/02/2017   Procedure: LEFT KNEE ARTHROSCOPY WITH PARTIAL MEDIAL MENISECTOMY;  Surgeon: Carole Civil, MD;  Location: [facility];  Service: Orthopedics;  Laterality: Left;  . POLYPECTOMY  04/07/2018   Procedure: POLYPECTOMY;  Surgeon: Danie Binder, MD;  [facility];  Service: Endoscopy;;  rectal polyp cs    Social History   Socioeconomic History  . Marital status: Single    Spouse name: [name]  . Number of children: 3  . Years of education: 16  . Highest education level: Bachelor's degree (e.g., BA, AB, BS)  Occupational History  . Occupation: woodworker    Comment: retired  . 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 Smoking status: Former Smoker    Years: 35.00    Types: Cigarettes    Quit date: 09/15/2002  Years since quitting: 17.2  . Smokeless tobacco: Never Used  Substance and Sexual Activity  . Alcohol use: Yes    Alcohol/week: 42.0 standard drinks    Types: 21 Cans of beer, 21 Shots of liquor per week    Comment: 6 drinks per day  . Drug use: No  . Sexual activity: Not Currently    Birth control/protection: None  Other Topics Concern  . Not on file  Social History Narrative   Lives with second wife [name], [place] woman   She travels a lot    Retired Armed forces technical officer   No reg exercise   Social Determinants of Radio broadcast assistant Strain:   . Difficulty of Paying Living Expenses: Not on file  Food Insecurity:   . Worried About Charity fundraiser in the Last Year: Not on file  . Ran Out of Food in the Last Year: Not on file  Transportation Needs:   . Lack of Transportation (Medical): Not on file  . Lack of Transportation (Non-Medical): Not on file  Physical Activity:   . Days of Exercise per Week: Not on file  . Minutes of Exercise per Session: Not on file  Stress:   . Feeling of Stress : Not on file  Social Connections:   . Frequency of Communication with Friends and Family: Not on file  . Frequency of Social Gatherings with Friends and Family: Not on file  . Attends Religious Services: Not on file  . Active Member of Clubs or Organizations: Not on file  . Attends Archivist Meetings: Not on file  . Marital Status: Not on file  Intimate Partner Violence:   . Fear of Current or Ex-Partner: Not on file  . Emotionally Abused: Not on file  . Physically Abused: Not on file  . Sexually Abused: Not on file     Vitals:   11/29/19 1304  BP: 118/68  Pulse: 78  SpO2: 98%  Weight: 241 lb (109.3 kg)  Height: 5\' 6"  (1.676 m)    Wt Readings from Last 3 Encounters:  11/29/19 241 lb (109.3 kg)  08/25/19 241 lb 3.2 oz (109.4 kg)  01/27/19 232 lb (105.2 kg)     PHYSICAL EXAM General: NAD HEENT: Normal. Neck: No JVD, no thyromegaly. Lungs: Clear to auscultation bilaterally with normal respiratory effort. CV: Regular rate and rhythm with premature contractions, normal S1/S2, no S3/S4, no murmur. No pretibial or periankle edema.  No carotid bruit.   Abdomen: Soft, nontender, no distention.  Neurologic: Alert and oriented.  Psych: Normal affect. Skin: Normal. Musculoskeletal: No gross deformities.      Labs: Lab Results  Component Value Date/Time   K 4.7 02/02/2018 10:12 AM   BUN 21  02/02/2018 10:12 AM   CREATININE 0.95 02/02/2018 10:12 AM   ALT 36 02/02/2018 10:12 AM   TSH 3.505 09/18/2017 03:29 PM   HGB 15.1 10/01/2017 08:24 AM     Lipids: Lab Results  Component Value Date/Time   LDLCALC 105 (H) 02/02/2018 10:12 AM   CHOL 186 02/02/2018 10:12 AM   TRIG 74 02/02/2018 10:12 AM   HDL 65 02/02/2018 10:12 AM       ASSESSMENT AND PLAN: 1.  PVCs: He is entirely asymptomatic.  This was an incidental finding at the time of EGD.  No medical therapy or cardiac testing is indicated at this time.    Disposition: Follow up as needed   Kate Sable, M.D., F.A.C.C.

## 2019-12-22 ENCOUNTER — Encounter: Payer: Self-pay | Admitting: Gastroenterology

## 2019-12-23 DIAGNOSIS — Z23 Encounter for immunization: Secondary | ICD-10-CM | POA: Diagnosis not present

## 2020-01-04 ENCOUNTER — Other Ambulatory Visit: Payer: Self-pay | Admitting: Radiology

## 2020-01-04 MED ORDER — MELOXICAM 15 MG PO TABS: 15.0000 mg | ORAL_TABLET | Freq: Every day | ORAL | 1 refills | Status: DC

## 2020-01-19 DIAGNOSIS — L570 Actinic keratosis: Secondary | ICD-10-CM | POA: Diagnosis not present

## 2020-01-19 DIAGNOSIS — L821 Other seborrheic keratosis: Secondary | ICD-10-CM | POA: Diagnosis not present

## 2020-01-19 DIAGNOSIS — Z85820 Personal history of malignant melanoma of skin: Secondary | ICD-10-CM | POA: Diagnosis not present

## 2020-01-21 DIAGNOSIS — Z23 Encounter for immunization: Secondary | ICD-10-CM | POA: Diagnosis not present

## 2020-01-27 ENCOUNTER — Ambulatory Visit: Payer: Medicare Other | Admitting: Gastroenterology

## 2020-02-15 IMAGING — US US ABDOMEN COMPLETE
1 series · 14 of 25 positions shown · non-contrast
Comparison: None.

CLINICAL DATA: Chronic right upper quadrant pain.

EXAM:
ABDOMEN ULTRASOUND COMPLETE

[Series 1: us abdomen complete · 0.21mm/px · 14 of 122 slices shown]
[im 1/122]
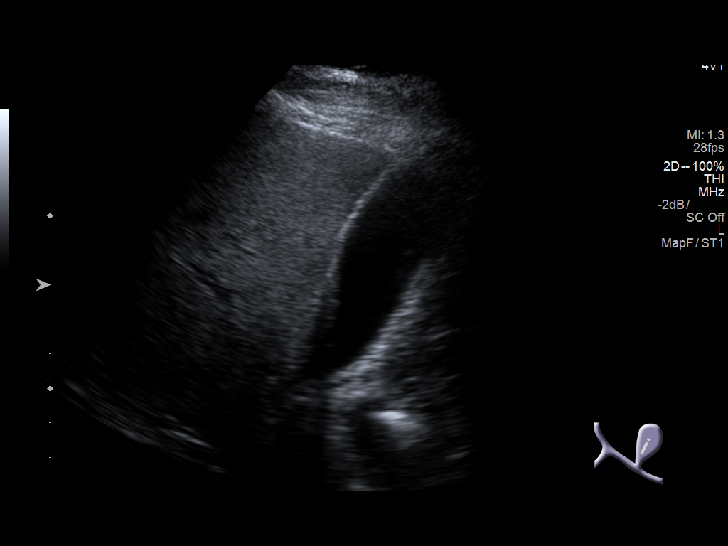
[im 11/122]
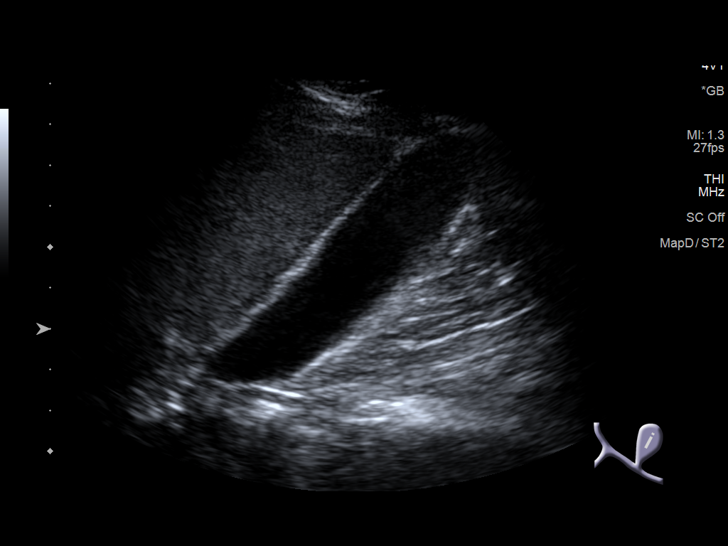
[im 21/122]
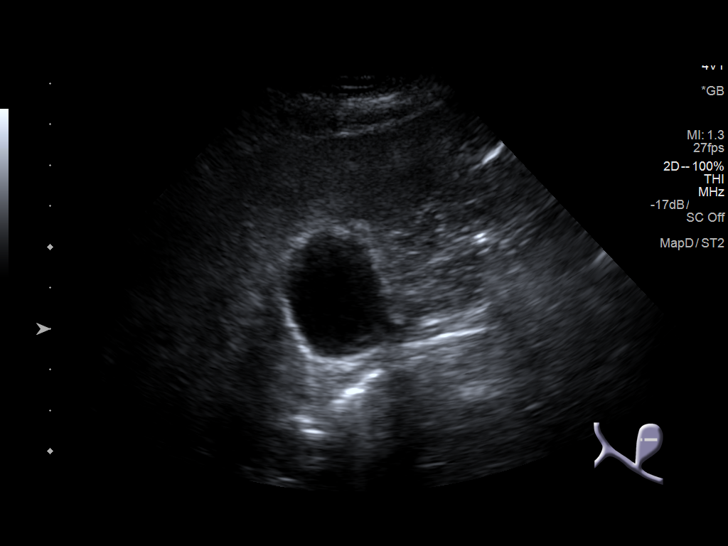
[im 31/122]
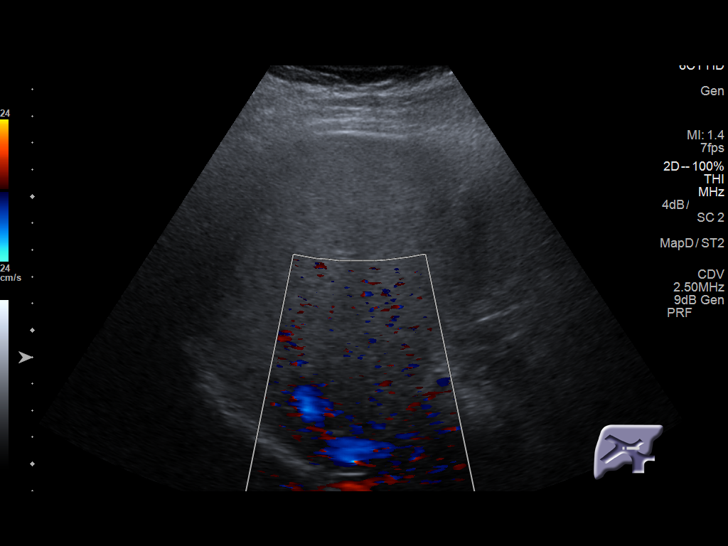
[im 41/122]
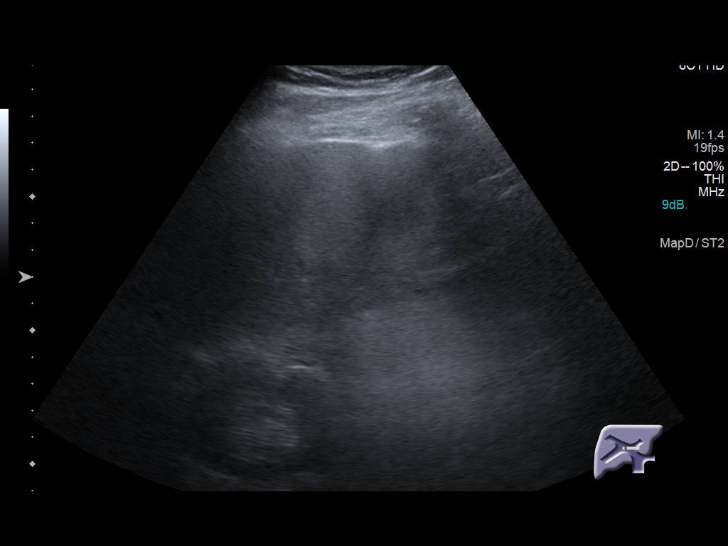
[im 46/122]
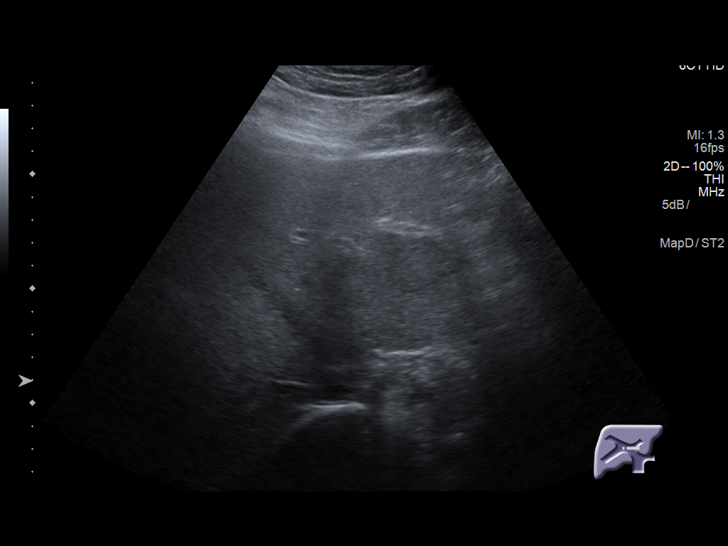
[im 56/122]
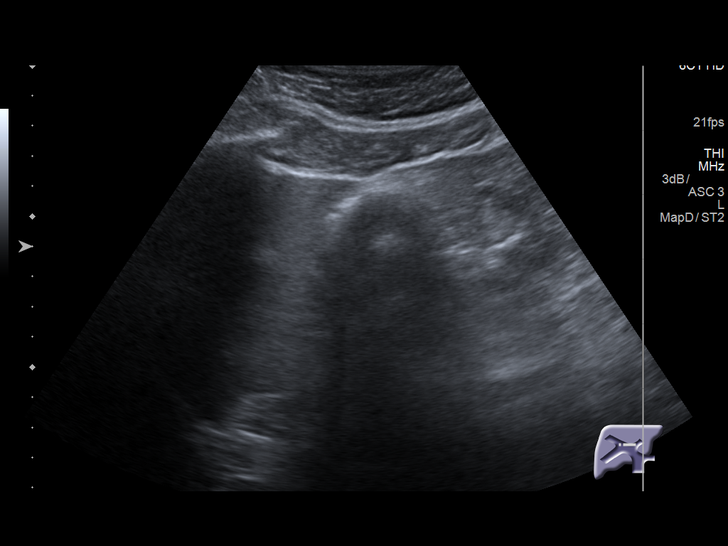
[im 66/122]
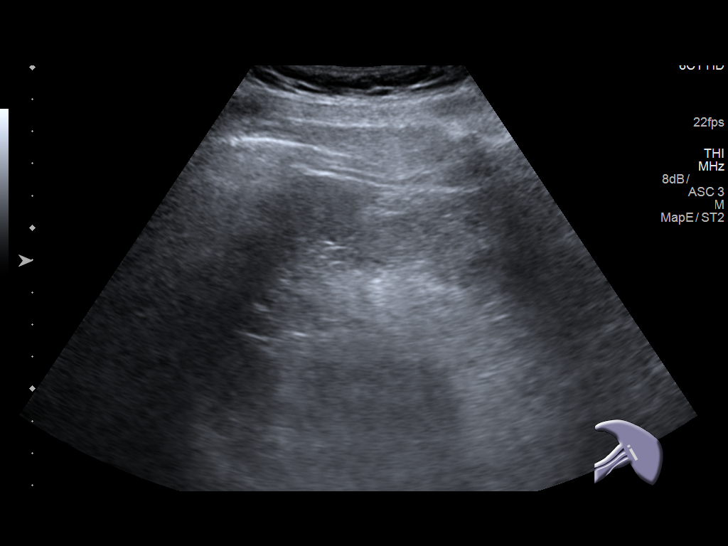
[im 76/122]
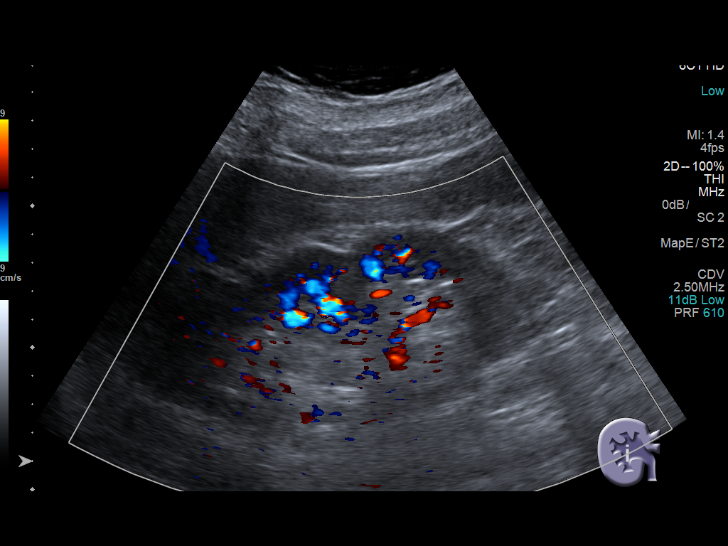
[im 81/122]
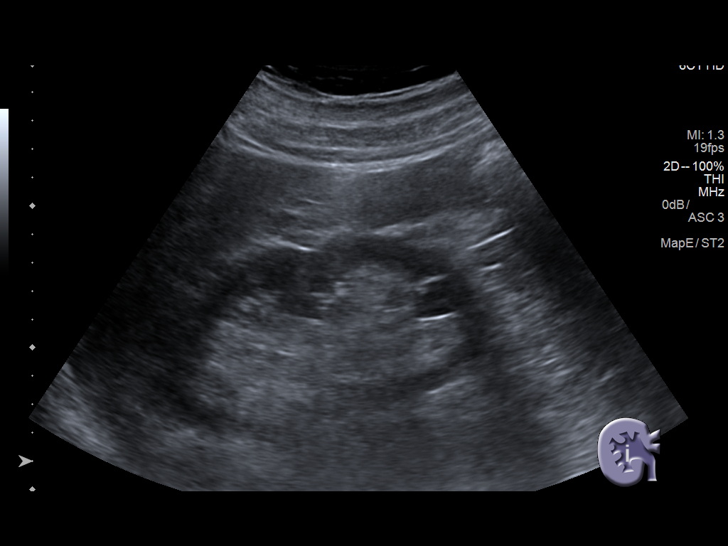
[im 91/122]
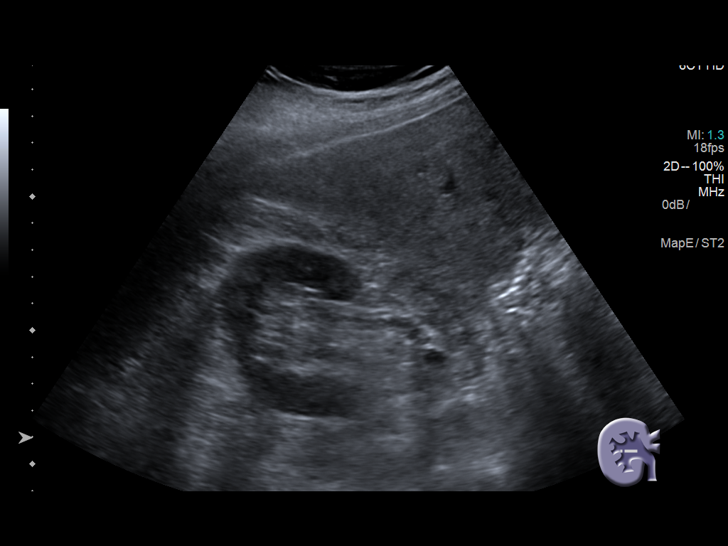
[im 101/122]
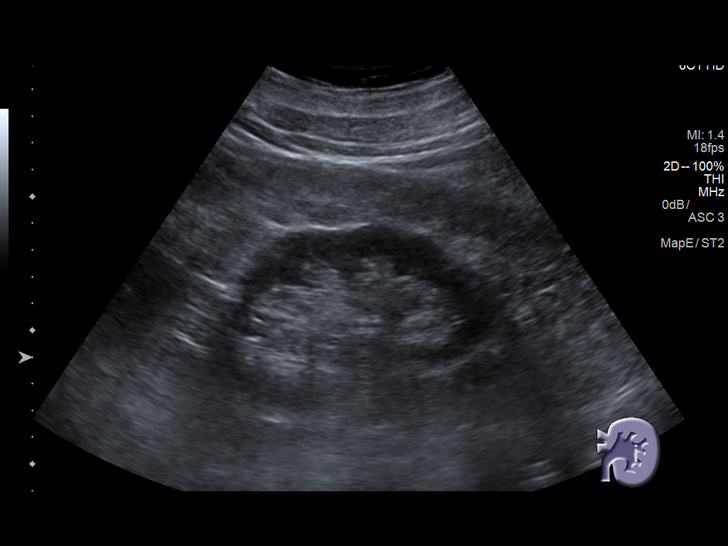
[im 111/122]
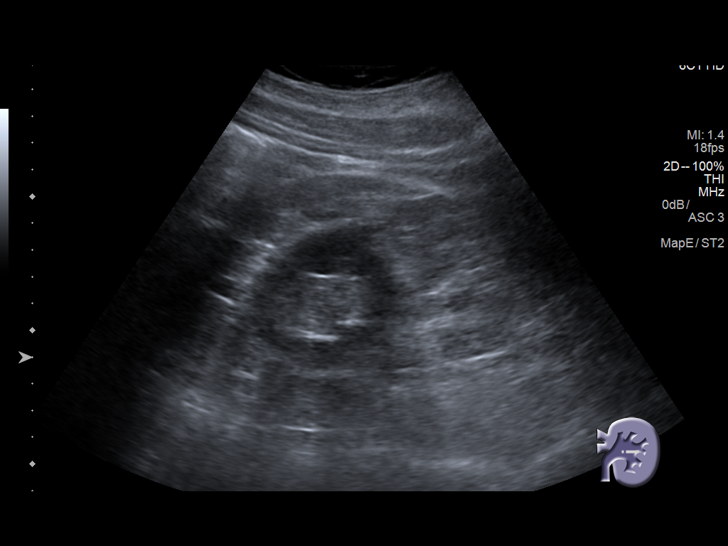
[im 122/122]
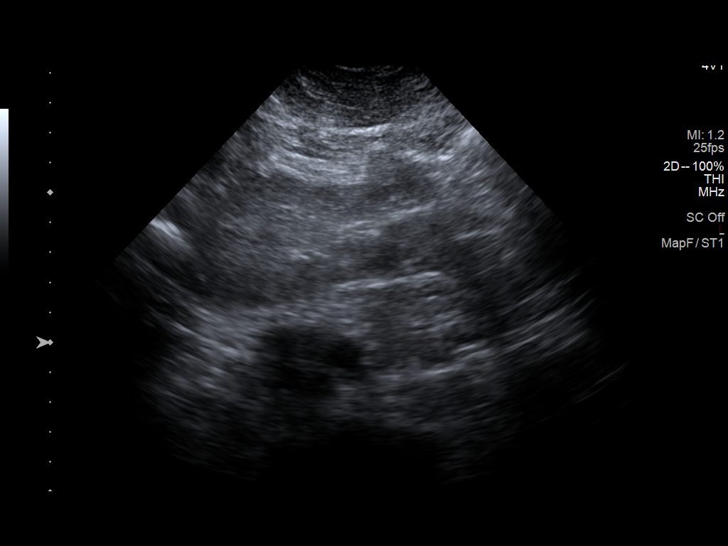

[14 of 25 positions shown; findings below may reference images not displayed]

FINDINGS: Gallbladder: No gallstones or wall thickening visualized. No
sonographic Murphy sign noted by sonographer.

Common bile duct: Diameter: 2 mm, normal.

Liver: No focal lesion identified. Increased in parenchymal
echogenicity. Portal vein is patent on color Doppler imaging with
normal direction of blood flow towards the liver.

IVC: No abnormality visualized.

Pancreas: Not visualized due to overlying bowel gas.

Spleen: Size and appearance within normal limits.

Right Kidney: Length: 13.3 cm. Echogenicity within normal limits. No
mass or hydronephrosis visualized. 1.5 cm simple cyst in the lower
pole.

Left Kidney: Length: 12.8 cm. Echogenicity within normal limits. No
mass or hydronephrosis visualized.

Abdominal aorta: The proximal aorta is borderline aneurysmal,
measuring 3.0 cm.

Other findings: None.
IMPRESSION: 1. Hepatic steatosis.
2. Borderline aneurysmal proximal abdominal aorta, measuring 3.0 cm.
Recommend followup by ultrasound in 3 years. This recommendation
follows ACR consensus guidelines: White Paper of the ACR Inc[employer]

## 2020-03-01 ENCOUNTER — Other Ambulatory Visit: Payer: Self-pay

## 2020-03-01 ENCOUNTER — Encounter: Payer: Self-pay | Admitting: Orthopedic Surgery

## 2020-03-01 ENCOUNTER — Ambulatory Visit (INDEPENDENT_AMBULATORY_CARE_PROVIDER_SITE_OTHER): Payer: Medicare Other | Admitting: Orthopedic Surgery

## 2020-03-01 ENCOUNTER — Ambulatory Visit: Payer: Medicare Other

## 2020-03-01 VITALS — BP 148/93 | HR 40 | Ht 66.0 in | Wt 241.0 lb

## 2020-03-01 DIAGNOSIS — M25521 Pain in right elbow: Secondary | ICD-10-CM | POA: Diagnosis not present

## 2020-03-01 DIAGNOSIS — M7712 Lateral epicondylitis, left elbow: Secondary | ICD-10-CM | POA: Diagnosis not present

## 2020-03-01 DIAGNOSIS — M7701 Medial epicondylitis, right elbow: Secondary | ICD-10-CM

## 2020-03-01 DIAGNOSIS — M25522 Pain in left elbow: Secondary | ICD-10-CM

## 2020-03-01 NOTE — Progress Notes (Signed)
Chief Complaint  Patient presents with  . 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 Sleep apnea     BP (!) 148/93   Pulse (!) 40   Ht 5\' 6"  (1.676 m)   Wt 241 lb (109.3 kg)   BMI 38.90 kg/m   Physical Exam Vitals and nursing note reviewed.  Constitutional:      Appearance: Normal appearance.  Musculoskeletal:     Right elbow: No swelling, deformity, effusion or lacerations. Normal range of motion. Tenderness present in medial epicondyle.     Left elbow: No swelling, deformity, effusion or lacerations. Decreased range of motion. Tenderness present in lateral epicondyle.  Neurological:     Mental Status: He is alert and oriented to person, place, and time.  Psychiatric:        Mood and Affect: Mood normal.     Both elbows show calcifications laterally near the epicondyle see report  X-rays were taken in the office   Dx Encounter Diagnoses  Name Primary?  . Bilateral elbow joint pain Yes  . Lateral epicondylitis of left elbow   . Golfer's elbow, right     We prescribed a program of him for ice NSAIDs Bracing and Sections Relative rest  Follow-up as needed  Procedure note injection for left tennis elbow  Diagnosis left tennis elbow  Anesthesia ethyl chloride was used Alcohol use is clean the skin  After we obtained verbal consent and timeout a 25-gauge needle was used to inject 40 mg of Depo-Medrol and 3 cc of 1% lidocaine just distal to the insertion of the ECRB  There were no complications and a sterile bandage was applied.   Injection for right golfers  elbow  Anesthesia ethyl chloride alcohol to clean the skin 25-gauge needle 40 mg of Depo 3 cc 1% lidocaine just distal to the insertion of the flexor pronator group no complication Band-Aid applied  Patient follow-up as needed

## 2020-03-01 NOTE — Patient Instructions (Signed)
Golfer's Elbow  Golfer's elbow, also called medial epicondylitis, is a condition that results from inflammation of the strong bands of tissue (tendons) that attach your forearm muscles to the inside of your bone at the elbow. These tendons affect the muscles that bend the palm toward the wrist (flexion). The tendons become less flexible with age. This condition is called golfer's elbow because it is more common among people who constantly bend and twist their wrists, such as golfers. This injury is usually caused by overuse. What are the causes? This condition is caused by:  Repeatedly flexing, turning, or twisting your wrist.  Constantly gripping objects with your hands. What increases the risk? This condition is more likely to develop in people who play golf, baseball, or tennis. This injury is more common among people who have jobs that require the constant use of their hands, such as:  Carpenters.  Butchers.  Musicians.  Typists. What are the signs or symptoms? This condition causes elbow pain that may spread to your forearm and upper arm. Symptoms of this condition include.  Pain at the inner elbow, forearm, or wrist.  Reduced grip strength. The pain may get worse when you bend your wrist downward. How is this diagnosed? This condition is diagnosed based on your symptoms, medical history, and a physical exam. During the exam, your health care provider may:  Test your grip strength.  Move your wrist to check for pain. You may also have an MRI to:  Confirm the diagnosis.  Look for other issues.  Check for tears in the ligaments, muscles, or tendons. How is this treated? Treatment for this condition includes:  Stopping all activities that make you bend or twist your elbow or wrist and waiting until your pain and other symptoms go away before resuming those activities.  Wearing an elbow brace or wrist splint to restrict the movements that cause symptoms.  Icing your  inner elbow, forearm, or wrist to relieve pain.  Taking NSAIDs or getting corticosteroid injections to reduce pain and swelling.  Doing stretching, range-of-motion, and strengthening exercises (physical therapy) as told by your health care provider. In rare cases, surgery may be needed if your condition does not improve. Follow these instructions at home: If you have a brace or splint:  Wear it as told by your health care provider.  Loosen it if your fingers tingle, become numb, or turn cold and blue.  Keep it clean. Managing pain, stiffness, and swelling   If directed, put ice on the injured area. ? Put ice in a plastic bag. ? Place a towel between your skin and the bag. ? Leave the ice on for 20 minutes, 2-3 times a day.  Move your fingers often to avoid stiffness.  Raise (elevate) the injured area above the level of your heart while you are sitting or lying down. Activity  Rest your injured area as told by your health care provider.  Return to your normal activities as told by your health care provider. Ask your health care provider what activities are safe for you.  Do exercises as told by your health care provider. Lifestyle  If your condition is caused by sports, work with a trainer to make sure that you: ? Have the correct technique. ? Are using the proper equipment.  If your condition is work related, talk with your employer about changes that can be made. General instructions  Take over-the-counter and prescription medicines only as told by your health care provider.  Do not   use any products that contain nicotine or tobacco, such as cigarettes, e-cigarettes, and chewing tobacco. If you need help quitting, ask your health care provider.  Keep all follow-up visits as told by your health care provider. This is important. How is this prevented?  Before and after activity: ? Warm up and stretch before being active. ? Cool down and stretch after being  active. ? Give your body time to rest between periods of activity.  During activity: ? Make sure to use equipment that fits you. ? If you play golf, slow your golf swing to reduce shock in the arm when making contact with the ball.  Maintain physical fitness, including: ? Strength. ? Flexibility. ? Cardiovascular fitness. ? Endurance.  Do exercises to strengthen the forearm muscles. Contact a health care provider if:  Your pain does not improve or it gets worse.  You notice numbness in your hand. Get help right away if:  Your pain is severe.  You cannot move your wrist. Summary  Golfer's elbow, also called medial epicondylitis, is a condition that results from inflammation of the strong bands of tissue (tendons) that attach your forearm muscles to the inside of your bone at the elbow.  This injury usually results from overuse.  Symptoms of this condition include decreased grip strength and pain at the inner elbow, forearm, or wrist.  This injury is treated with rest, ice, medicines, physical therapy, and surgery as needed. This information is not intended to replace advice given to you by your health care provider. Make sure you discuss any questions you have with your health care provider. Document Revised: 02/25/2019 Document Reviewed: 09/10/2018 Elsevier Patient Education  [place].  Tennis Elbow Tennis elbow is swelling (inflammation) in your outer forearm, near your elbow. Swelling affects the tissues that connect muscle to bone (tendons). Tennis elbow can happen in any sport or job in which you use your elbow too much. It is caused by doing the same motion over and over. Tennis elbow can cause:  Pain and tenderness in your forearm and the outer part of your elbow. You may have pain all the time, or only when using the arm.  A burning feeling. This runs from your elbow through your arm.  Weak grip in your hand. Follow these instructions at  home: Activity  Rest your elbow and wrist. Avoid activities that cause problems, as told by your doctor.  If told by your doctor, wear an elbow strap to reduce stress on the area.  Do physical therapy exercises as told.  If you lift an object, lift it with your palm facing up. This is easier on your elbow. Lifestyle  If your tennis elbow is caused by sports, check your equipment and make sure that: ? You are using it correctly. ? It fits you well.  If your tennis elbow is caused by work or by using a computer, take breaks often to stretch your arm. Talk with your manager about how you can manage your condition at work. If you have a brace:  Wear the brace as told by your doctor. Remove it only as told by your doctor.  Loosen the brace if your fingers tingle, get numb, or turn cold and blue.  Keep the brace clean.  If the brace is not waterproof, ask your doctor if you may take the brace off for bathing. If you must keep the brace on while bathing: ? Do not let it get wet. ? Cover it with a  watertight covering when you take a bath or a shower. General instructions   If told, put ice on the painful area: ? Put ice in a plastic bag. ? Place a towel between your skin and the bag. ? Leave the ice on for 20 minutes, 2-3 times a day.  Take over-the-counter and prescription medicines only as told by your doctor.  Keep all follow-up visits as told by your doctor. This is important. Contact a doctor if:  Your pain does not get better with treatment.  Your pain gets worse.  You have weakness in your forearm, hand, or fingers.  You cannot feel your forearm, hand, or fingers. Summary  Tennis elbow is swelling (inflammation) in your outer forearm, near your elbow.  Tennis elbow is caused by doing the same motion over and over.  Rest your elbow and wrist. Avoid activities that cause problems, as told by your doctor.  If told, put ice on the painful area for 20 minutes, 2-3  times a day. This information is not intended to replace advice given to you by your health care provider. Make sure you discuss any questions you have with your health care provider. Document Revised: 07/31/2018 Document Reviewed: 08/19/2017 Elsevier Patient Education  [place].

## 2020-03-29 ENCOUNTER — Ambulatory Visit: Payer: Medicare Other | Admitting: Gastroenterology

## 2020-04-05 ENCOUNTER — Ambulatory Visit: Payer: Medicare Other | Admitting: Orthopedic Surgery

## 2020-04-14 ENCOUNTER — Ambulatory Visit (INDEPENDENT_AMBULATORY_CARE_PROVIDER_SITE_OTHER): Payer: Medicare Other | Admitting: Orthopedic Surgery

## 2020-04-14 ENCOUNTER — Other Ambulatory Visit: Payer: Self-pay

## 2020-04-14 DIAGNOSIS — M7702 Medial epicondylitis, left elbow: Secondary | ICD-10-CM | POA: Diagnosis not present

## 2020-04-14 DIAGNOSIS — M7711 Lateral epicondylitis, right elbow: Secondary | ICD-10-CM

## 2020-04-14 DIAGNOSIS — M7701 Medial epicondylitis, right elbow: Secondary | ICD-10-CM

## 2020-04-14 NOTE — Patient Instructions (Signed)
Ice after injection / pay attention to any repetitive motion you are doing to inflame the area /usually it is from constant gripping, try to loosen your grip on tools when you can.   Golfer's Elbow  Golfer's elbow, also called medial epicondylitis, is a condition that results from inflammation of the strong bands of tissue (tendons) that attach your forearm muscles to the inside of your bone at the elbow. These tendons affect the muscles that bend the palm toward the wrist (flexion). The tendons become less flexible with age. This condition is called golfer's elbow because it is more common among people who constantly bend and twist their wrists, such as golfers. This injury is usually caused by overuse. What are the causes? This condition is caused by:  Repeatedly flexing, turning, or twisting your wrist.  Constantly gripping objects with your hands. What increases the risk? This condition is more likely to develop in people who play golf, baseball, or tennis. This injury is more common among people who have jobs that require the constant use of their hands, such as:  Carpenters.  Butchers.  Musicians.  Typists. What are the signs or symptoms? This condition causes elbow pain that may spread to your forearm and upper arm. Symptoms of this condition include.  Pain at the inner elbow, forearm, or wrist.  Reduced grip strength. The pain may get worse when you bend your wrist downward. How is this diagnosed? This condition is diagnosed based on your symptoms, medical history, and a physical exam. During the exam, your health care provider may:  Test your grip strength.  Move your wrist to check for pain. You may also have an MRI to:  Confirm the diagnosis.  Look for other issues.  Check for tears in the ligaments, muscles, or tendons. How is this treated? Treatment for this condition includes:  Stopping all activities that make you bend or twist your elbow or wrist and  waiting until your pain and other symptoms go away before resuming those activities.  Wearing an elbow brace or wrist splint to restrict the movements that cause symptoms.  Icing your inner elbow, forearm, or wrist to relieve pain.  Taking NSAIDs or getting corticosteroid injections to reduce pain and swelling.  Doing stretching, range-of-motion, and strengthening exercises (physical therapy) as told by your health care provider. In rare cases, surgery may be needed if your condition does not improve. Follow these instructions at home: If you have a brace or splint:  Wear it as told by your health care provider.  Loosen it if your fingers tingle, become numb, or turn cold and blue.  Keep it clean. Managing pain, stiffness, and swelling   If directed, put ice on the injured area. ? Put ice in a plastic bag. ? Place a towel between your skin and the bag. ? Leave the ice on for 20 minutes, 2-3 times a day.  Move your fingers often to avoid stiffness.  Raise (elevate) the injured area above the level of your heart while you are sitting or lying down. Activity  Rest your injured area as told by your health care provider.  Return to your normal activities as told by your health care provider. Ask your health care provider what activities are safe for you.  Do exercises as told by your health care provider. Lifestyle  If your condition is caused by sports, work with a trainer to make sure that you: ? Have the correct technique. ? Are using the proper equipment.  If your condition is work related, talk with your employer about changes that can be made. General instructions  Take over-the-counter and prescription medicines only as told by your health care provider.  Do not use any products that contain nicotine or tobacco, such as cigarettes, e-cigarettes, and chewing tobacco. If you need help quitting, ask your health care provider.  Keep all follow-up visits as told by your  health care provider. This is important. How is this prevented?  Before and after activity: ? Warm up and stretch before being active. ? Cool down and stretch after being active. ? Give your body time to rest between periods of activity.  During activity: ? Make sure to use equipment that fits you. ? If you play golf, slow your golf swing to reduce shock in the arm when making contact with the ball.  Maintain physical fitness, including: ? Strength. ? Flexibility. ? Cardiovascular fitness. ? Endurance.  Do exercises to strengthen the forearm muscles. Contact a health care provider if:  Your pain does not improve or it gets worse.  You notice numbness in your hand. Get help right away if:  Your pain is severe.  You cannot move your wrist. Summary  Golfer's elbow, also called medial epicondylitis, is a condition that results from inflammation of the strong bands of tissue (tendons) that attach your forearm muscles to the inside of your bone at the elbow.  This injury usually results from overuse.  Symptoms of this condition include decreased grip strength and pain at the inner elbow, forearm, or wrist.  This injury is treated with rest, ice, medicines, physical therapy, and surgery as needed. This information is not intended to replace advice given to you by your health care provider. Make sure you discuss any questions you have with your health care provider. Document Revised: 02/25/2019 Document Reviewed: 09/10/2018 Elsevier Patient Education  2020 [employer].

## 2020-04-14 NOTE — Progress Notes (Addendum)
Chief Complaint  Patient presents with  . Elbow Pain    Recheck bilateral elbow pain.   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  Did well  Follow-up as needed  Encounter Diagnoses  Name Primary?  . Golfer's elbow, right Yes  . Right tennis elbow   . Golfer's elbow, left

## 2020-07-06 ENCOUNTER — Other Ambulatory Visit: Payer: Self-pay | Admitting: Orthopedic Surgery

## 2020-07-06 MED ORDER — MELOXICAM 15 MG PO TABS: 15.0000 mg | ORAL_TABLET | Freq: Every day | ORAL | 1 refills | Status: DC

## 2020-07-06 NOTE — Telephone Encounter (Signed)
Call from patient for refill: meloxicam (MOBIC) 15 MG tablet 90 tablet  -Betsy Laynepatient states pharmacy was to send a request 3 days ago

## 2020-07-10 ENCOUNTER — Other Ambulatory Visit: Payer: Self-pay | Admitting: Orthopedic Surgery

## 2020-08-02 DIAGNOSIS — D1801 Hemangioma of skin and subcutaneous tissue: Secondary | ICD-10-CM | POA: Diagnosis not present

## 2020-08-02 DIAGNOSIS — L918 Other hypertrophic disorders of the skin: Secondary | ICD-10-CM | POA: Diagnosis not present

## 2020-08-02 DIAGNOSIS — L570 Actinic keratosis: Secondary | ICD-10-CM | POA: Diagnosis not present

## 2020-08-02 DIAGNOSIS — Z85820 Personal history of malignant melanoma of skin: Secondary | ICD-10-CM | POA: Diagnosis not present

## 2020-08-02 DIAGNOSIS — L812 Freckles: Secondary | ICD-10-CM | POA: Diagnosis not present

## 2020-08-02 DIAGNOSIS — L820 Inflamed seborrheic keratosis: Secondary | ICD-10-CM | POA: Diagnosis not present

## 2020-08-16 ENCOUNTER — Telehealth: Payer: Self-pay | Admitting: Internal Medicine

## 2020-08-16 NOTE — Telephone Encounter (Signed)
Can you get juror number and dates that he was requested to serve?

## 2020-08-16 NOTE — Telephone Encounter (Signed)
Spoke with pt. Pt is going to get the date of [place] duty and drop off a copy of the form he has. Pt states that he can fill the form out and if the form needs to be reviewed, it can be. Pt is aware that a letter can be given from our office that he can attach to the form.

## 2020-08-16 NOTE — Telephone Encounter (Signed)
Patient called stating he received a letter for jury duty and he really can not do it because of his condition Dr. Oneida Alar treated him for.  He is asking for a note he can turn in with his summons explaining that he is unable to commit to jury duty

## 2020-08-16 NOTE — Telephone Encounter (Signed)
Letter was given to pt.

## 2020-08-16 NOTE — Telephone Encounter (Signed)
Letter completed for given to patient today.

## 2020-08-16 NOTE — Telephone Encounter (Signed)
Routing to Neil Crouch, [place]. Pt has an hx ox of diarrhea. Please advise.

## 2020-09-28 ENCOUNTER — Encounter: Payer: Self-pay | Admitting: Internal Medicine

## 2020-09-28 ENCOUNTER — Ambulatory Visit (INDEPENDENT_AMBULATORY_CARE_PROVIDER_SITE_OTHER): Payer: Medicare Other | Admitting: Gastroenterology

## 2020-09-28 ENCOUNTER — Encounter: Payer: Self-pay | Admitting: Gastroenterology

## 2020-09-28 ENCOUNTER — Other Ambulatory Visit: Payer: Self-pay

## 2020-09-28 VITALS — BP 160/87 | HR 88 | Temp 97.0°F | Ht 66.0 in | Wt 251.6 lb

## 2020-09-28 DIAGNOSIS — K219 Gastro-esophageal reflux disease without esophagitis: Secondary | ICD-10-CM

## 2020-09-28 MED ORDER — PANTOPRAZOLE SODIUM 40 MG PO TBEC: 40.0000 mg | DELAYED_RELEASE_TABLET | Freq: Every day | ORAL | 3 refills | Status: DC

## 2020-09-28 NOTE — Progress Notes (Signed)
Cc'ed to pcp °

## 2020-09-28 NOTE — Progress Notes (Signed)
Referring Provider: Celene Squibb, MD Primary Care Physician:  Celene Squibb, MD Primary GI: Dr. Abbey Chatters   Chief Complaint  Patient presents with  . Gastroesophageal Reflux    bad taste in back of throat all the time. he stopped refilling pantoprazole since Dr. Oneida Alar left    HPI:   Peter Mora is a 74 y.o. male presenting today with a history of chronic GERD, last seen a year ago (Oct 2020).   Has noted bad taste in back of throat, occasional indigestion. No dysphagia. No abdominal pain. Off pantoprazole about 6 months. No abdominal pain. No N/V. Doesn't like to take prescriptions unless necessary. Occasional low-volume hematochezia in setting of known hemorrhoids. Diarrhea improved with lactaid tablets.   Past Medical History:  Diagnosis Date  . 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 Sleep apnea     Past Surgical History:  Procedure Laterality Date  . BACK SURGERY     excision of melanoma  . BIOPSY  04/07/2018   Procedure: BIOPSY;  Surgeon: Danie Binder, MD;  [facility];  Service: Endoscopy;;  random colon biopsy  . BIOPSY  11/23/2019   Procedure: BIOPSY;  Surgeon: Danie Binder, MD;  [facility];  Service: Endoscopy;;  duodenal bulb, second portion of duodenal , duodenal nodule   . COLONOSCOPY WITH PROPOFOL N/A 04/07/2018   one 4 mm hyperplastic rectal polyp, redundant left colon, external and internal hemorrhoids  . ESOPHAGOGASTRODUODENOSCOPY (EGD) WITH PROPOFOL N/A 04/07/2018   Negative Barrett's. medium sized hiatal hernia. moderate gastritis/duodenitis  . ESOPHAGOGASTRODUODENOSCOPY (EGD) WITH PROPOFOL N/A 11/23/2019   Procedure: ESOPHAGOGASTRODUODENOSCOPY (EGD) WITH PROPOFOL;  Surgeon: Danie Binder, MD;  [facility];  Service: Endoscopy;  Laterality: N/A;  11:15am  . KNEE ARTHROSCOPY WITH MEDIAL MENISECTOMY Left 10/02/2017   Procedure: LEFT KNEE ARTHROSCOPY WITH PARTIAL MEDIAL MENISECTOMY;  Surgeon: Carole Civil, MD;   Location: [facility];  Service: Orthopedics;  Laterality: Left;  . POLYPECTOMY  04/07/2018   Procedure: POLYPECTOMY;  Surgeon: Danie Binder, MD;  [facility];  Service: Endoscopy;;  rectal polyp cs    Current Outpatient Medications  Medication Sig Dispense Refill  . cholecalciferol (VITAMIN D3) 25 MCG (1000 UT) tablet Take 1,000 Units by mouth daily.    . meloxicam (MOBIC) 15 MG tablet Take 1 tablet (15 mg total) by mouth daily. 90 tablet 1  . Multiple Vitamin (MULTIVITAMIN WITH MINERALS) TABS tablet Take 1 tablet by mouth daily.    . vitamin B-12 (CYANOCOBALAMIN) 1000 MCG tablet Take 1,000 mcg by mouth daily.    . pantoprazole (PROTONIX) 40 MG tablet Take 1 tablet (40 mg total) by mouth daily. 30 minutes before breakfast 90 tablet 3   No current facility-administered medications for this visit.    Allergies as of 09/28/2020  . (No Known Allergies)    Family History  Problem Relation Age of Onset  . Cancer Mother        esophageal  . Cancer Sister        colon and breast   . Cancer Brother        colon  . Cancer Father 68       agressive lung cancer    Social History   Socioeconomic History  . Marital status: Single    Spouse name: Peter "shelly"  . Number of children: 3  . Years of education: 16  . Highest education level: Bachelor's degree (e.g., BA,  AB, BS)  Occupational History  . Occupation: woodworker    Comment: retired  . 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 Smoking status: Former Smoker    Years: 35.00    Types: Cigarettes    Quit date: 09/15/2002    Years since quitting: 18.0  . Smokeless tobacco: Never Used  Vaping Use  . Vaping Use: Never used  Substance and Sexual Activity  . Alcohol use: Yes    Alcohol/week: 42.0 standard drinks    Types: 21 Cans of beer, 21 Shots of liquor per week    Comment: 6 drinks per day  . Drug use: No  . Sexual activity: Not Currently    Birth control/protection: None  Other Topics Concern  . Not  on file  Social History Narrative   Lives with second wife Peter Mora, [place] woman   She travels a lot   Retired Armed forces technical officer   No reg exercise   Social Determinants of Radio broadcast assistant Strain:   . Difficulty of Paying Living Expenses: Not on file  Food Insecurity:   . Worried About Charity fundraiser in the Last Year: Not on file  . Ran Out of Food in the Last Year: Not on file  Transportation Needs:   . Lack of Transportation (Medical): Not on file  . Lack of Transportation (Non-Medical): Not on file  Physical Activity:   . Days of Exercise per Week: Not on file  . Minutes of Exercise per Session: Not on file  Stress:   . Feeling of Stress : Not on file  Social Connections:   . Frequency of Communication with Friends and Family: Not on file  . Frequency of Social Gatherings with Friends and Family: Not on file  . Attends Religious Services: Not on file  . Active Member of Clubs or Organizations: Not on file  . Attends Archivist Meetings: Not on file  . Marital Status: Not on file    Review of Systems: Gen: Denies fever, chills, anorexia. Denies fatigue, weakness, weight loss.  CV: Denies chest pain, palpitations, syncope, peripheral edema, and claudication. Resp: Denies dyspnea at rest, cough, wheezing, coughing up blood, and pleurisy. GI: see HPI Derm: Denies rash, itching, dry skin Psych: Denies depression, anxiety, memory loss, confusion. No homicidal or suicidal ideation.  Heme: Denies bruising, bleeding, and enlarged lymph nodes.  Physical Exam: BP (!) 160/87   Pulse 88   Temp (!) 97 F (36.1 C)   Ht 5\' 6"  (1.676 m)   Wt 251 lb 9.6 oz (114.1 kg)   BMI 40.61 kg/m  General:   Alert and oriented. No distress noted. Pleasant and cooperative.  Head:  Normocephalic and atraumatic. Eyes:  Conjuctiva clear without scleral icterus. Mouth:  Oral mucosa pink and moist. Good dentition. No lesions. Abdomen:  +BS, soft, non-tender and  non-distended. No rebound or guarding. No HSM or masses noted. Msk:  Symmetrical without gross deformities. Normal posture. Extremities:  Without edema. Neurologic:  Alert and  oriented x4 Psych:  Alert and cooperative. Normal mood and affect.  ASSESSMENT/PLAN: Peter Mora is a 74 y.o. male presenting today with history of GERD, off PPI for about 6 months, now with bad taste in back of throat and intermittent reflux. Will resume Protonix. Could have element of LPR. No concerning signs/symptoms. EGD on file from 2019.   Discussed signs/symptoms to report. Protonix sent to pharmacy. Return in 6-8 months or sooner if needed.   Annitta Needs, PhD,  ANP-BC [facility]

## 2020-09-28 NOTE — Patient Instructions (Signed)
I have sent in pantoprazole (Protonix) to take once each morning, 30 minutes before breakfast. Please call if this is not helpful with your symptoms.  We will see you in 6-8 months!  Have a great holiday season!  I enjoyed seeing you again today! As you know, I value our relationship and want to provide genuine, compassionate, and quality care. I welcome your feedback. If you receive a survey regarding your visit,  I greatly appreciate you taking time to fill this out. See you next time!  Annitta Needs, PhD, ANP-BC [facility]

## 2021-01-30 DIAGNOSIS — L821 Other seborrheic keratosis: Secondary | ICD-10-CM | POA: Diagnosis not present

## 2021-01-30 DIAGNOSIS — L570 Actinic keratosis: Secondary | ICD-10-CM | POA: Diagnosis not present

## 2021-01-30 DIAGNOSIS — Z85820 Personal history of malignant melanoma of skin: Secondary | ICD-10-CM | POA: Diagnosis not present

## 2021-01-30 DIAGNOSIS — L812 Freckles: Secondary | ICD-10-CM | POA: Diagnosis not present

## 2021-03-24 ENCOUNTER — Other Ambulatory Visit: Payer: Self-pay | Admitting: Orthopedic Surgery

## 2021-04-19 ENCOUNTER — Ambulatory Visit: Payer: Medicare Other | Admitting: Gastroenterology

## 2021-05-07 ENCOUNTER — Ambulatory Visit (INDEPENDENT_AMBULATORY_CARE_PROVIDER_SITE_OTHER): Payer: Medicare Other | Admitting: Orthopedic Surgery

## 2021-05-07 ENCOUNTER — Encounter: Payer: Self-pay | Admitting: Orthopedic Surgery

## 2021-05-07 ENCOUNTER — Other Ambulatory Visit: Payer: Self-pay

## 2021-05-07 DIAGNOSIS — M7702 Medial epicondylitis, left elbow: Secondary | ICD-10-CM

## 2021-05-07 DIAGNOSIS — M7701 Medial epicondylitis, right elbow: Secondary | ICD-10-CM | POA: Diagnosis not present

## 2021-05-07 NOTE — Patient Instructions (Signed)

## 2021-05-07 NOTE — Progress Notes (Signed)
Chief Complaint  Patient presents with   Injections    Bilateral elbows wants injections     Radiology has requested 2 injections for medial elbow pain.  Underlying history of bilateral golfers elbow  He is point tender in each areas and we injected with his consent and with proper timeout  We injected 40 mg of Depo-Medrol 1 cc to 2 cc of 1% lidocaine  Used ethyl chloride and alcohol to prepare the [place] for the injection and each was done without complication he will follow-up on an as-needed basis

## 2021-06-17 ENCOUNTER — Other Ambulatory Visit: Payer: Self-pay | Admitting: Orthopedic Surgery

## 2021-08-02 DIAGNOSIS — D1801 Hemangioma of skin and subcutaneous tissue: Secondary | ICD-10-CM | POA: Diagnosis not present

## 2021-08-02 DIAGNOSIS — L812 Freckles: Secondary | ICD-10-CM | POA: Diagnosis not present

## 2021-08-02 DIAGNOSIS — L820 Inflamed seborrheic keratosis: Secondary | ICD-10-CM | POA: Diagnosis not present

## 2021-08-02 DIAGNOSIS — L821 Other seborrheic keratosis: Secondary | ICD-10-CM | POA: Diagnosis not present

## 2021-08-02 DIAGNOSIS — L570 Actinic keratosis: Secondary | ICD-10-CM | POA: Diagnosis not present

## 2021-08-02 DIAGNOSIS — L578 Other skin changes due to chronic exposure to nonionizing radiation: Secondary | ICD-10-CM | POA: Diagnosis not present

## 2021-08-02 DIAGNOSIS — Z85820 Personal history of malignant melanoma of skin: Secondary | ICD-10-CM | POA: Diagnosis not present

## 2021-08-28 ENCOUNTER — Ambulatory Visit: Payer: Medicare Other | Admitting: Gastroenterology

## 2021-09-24 ENCOUNTER — Other Ambulatory Visit: Payer: Self-pay | Admitting: Orthopedic Surgery

## 2021-10-02 DIAGNOSIS — Z23 Encounter for immunization: Secondary | ICD-10-CM | POA: Diagnosis not present

## 2021-10-10 ENCOUNTER — Ambulatory Visit (INDEPENDENT_AMBULATORY_CARE_PROVIDER_SITE_OTHER): Payer: Medicare Other | Admitting: Internal Medicine

## 2021-10-10 ENCOUNTER — Encounter: Payer: Self-pay | Admitting: Internal Medicine

## 2021-10-10 ENCOUNTER — Other Ambulatory Visit: Payer: Self-pay

## 2021-10-10 VITALS — BP 139/89 | HR 94 | Temp 97.2°F | Ht 66.0 in | Wt 237.8 lb

## 2021-10-10 DIAGNOSIS — K219 Gastro-esophageal reflux disease without esophagitis: Secondary | ICD-10-CM

## 2021-10-10 DIAGNOSIS — R194 Change in bowel habit: Secondary | ICD-10-CM | POA: Diagnosis not present

## 2021-10-10 MED ORDER — DICYCLOMINE HCL 10 MG PO CAPS: 10.0000 mg | ORAL_CAPSULE | Freq: Three times a day (TID) | ORAL | 1 refills | Status: DC

## 2021-10-10 NOTE — Patient Instructions (Signed)
I am happy to hear that your reflux is well controlled on pantoprazole.  We will continue on this medication.  For your bowel issues, I am going to start you on a new medication called dicyclomine.  He can take this medication up to 4 times a day.  I have sent this to your pharmacy.  I would continue to avoid lactose as best as you can as this is likely playing a role as well.  Follow-up in 3 to 4 months.  It was nice meeting you today.  I hope you have a great Thanksgiving and Christmas.  Dr. Abbey Chatters  At [facility] we value your feedback. You may receive a survey about your visit today. Please share your experience as we strive to create trusting relationships with our patients to provide genuine, compassionate, quality care.  We appreciate your understanding and patience as we review any laboratory studies, imaging, and other diagnostic tests that are ordered as we care for you. Our office policy is 5 business days for review of these results, and any emergent or urgent results are addressed in a timely manner for your best interest. If you do not hear from our office in 1 week, please contact us.   We also encourage the use of MyChart, which contains your medical information for your review as well. If you are not enrolled in this feature, an access code is on this after visit summary for your convenience. Thank you for allowing [place] to be involved in your care.  It was great to see you today!  I hope you have a great rest of your Fall!    Peter Mora. Abbey Chatters, D.O. Gastroenterology and Hepatology [facility]

## 2021-10-10 NOTE — Progress Notes (Signed)
Referring Provider: Celene Squibb, MD Primary Care Physician:  Celene Squibb, MD Primary GI:  Dr. Abbey Chatters  Chief Complaint  Patient presents with   Gastroesophageal Reflux    Doing fine, no discomfort    HPI:   Peter Mora is a 75 y.o. male who presents to clinic today for yearly follow-up visit.  Has chronic GERD which is well controlled on pantoprazole 40 mg daily.  States as long as he takes his medication his symptoms are well controlled.  No chest pain or epigastric pain.  No dysphagia odynophagia.  No melena hematochezia.  Does note chronic issues with his bowels.  States he will wake up proximately 330 to 4 AM every morning with urgency.  States he has a least 6-7 bowel movements a day.  Sometimes loose.  Colonoscopy 2019 unremarkable besides 1 hyperplastic polyp removed.  Random colon biopsies negative for microscopic colitis.  EGD January 2021 mild gastritis/duodenitis, biopsies negative for celiac disease.  Was recommended he go on a lactose-free diet which she states does improve his bowel issues.  Does occasionally eat cheese.  Does not take Lactaid pills.  Chronically on Mobic for knee pain.  Does note pain on a course of what sounds like Xifaxan in the past which did not improve symptoms  Past Medical History:  Diagnosis Date   Arthritis    in both knees   Sleep apnea     Past Surgical History:  Procedure Laterality Date   BACK SURGERY     excision of melanoma   BIOPSY  04/07/2018   Procedure: BIOPSY;  Surgeon: Danie Binder, MD;  [facility];  Service: Endoscopy;;  random colon biopsy   BIOPSY  11/23/2019   Procedure: BIOPSY;  Surgeon: Danie Binder, MD;  [facility];  Service: Endoscopy;;  duodenal bulb, second portion of duodenal , duodenal nodule    COLONOSCOPY WITH PROPOFOL N/A 04/07/2018   one 4 mm hyperplastic rectal polyp, redundant left colon, external and internal hemorrhoids   ESOPHAGOGASTRODUODENOSCOPY (EGD) WITH  PROPOFOL N/A 04/07/2018   Negative Barrett's. medium sized hiatal hernia. moderate gastritis/duodenitis   ESOPHAGOGASTRODUODENOSCOPY (EGD) WITH PROPOFOL N/A 11/23/2019   Procedure: ESOPHAGOGASTRODUODENOSCOPY (EGD) WITH PROPOFOL;  Surgeon: Danie Binder, MD;  [facility];  Service: Endoscopy;  Laterality: N/A;  11:15am   KNEE ARTHROSCOPY WITH MEDIAL MENISECTOMY Left 10/02/2017   Procedure: LEFT KNEE ARTHROSCOPY WITH PARTIAL MEDIAL MENISECTOMY;  Surgeon: Carole Civil, MD;  Location: [facility];  Service: Orthopedics;  Laterality: Left;   POLYPECTOMY  04/07/2018   Procedure: POLYPECTOMY;  Surgeon: Danie Binder, MD;  [facility];  Service: Endoscopy;;  rectal polyp cs    Current Outpatient Medications  Medication Sig Dispense Refill   CALCIUM-MAGNESIUM-ZINC PO Take by mouth daily.     cholecalciferol (VITAMIN D3) 25 MCG (1000 UT) tablet Take 1,000 Units by mouth daily.     dicyclomine (BENTYL) 10 MG capsule Take 1 capsule (10 mg total) by mouth 4 (four) times daily -  before meals and at bedtime. 360 capsule 1   meloxicam (MOBIC) 15 MG tablet Take 1 tablet by mouth once daily 90 tablet 0   Multiple Vitamin (MULTIVITAMIN WITH MINERALS) TABS tablet Take 1 tablet by mouth daily.     pantoprazole (PROTONIX) 40 MG tablet Take 1 tablet (40 mg total) by mouth daily. 30 minutes before breakfast 90 tablet 3   Potassium (POTASSIMIN PO) Take by mouth daily.  vitamin B-12 (CYANOCOBALAMIN) 1000 MCG tablet Take 1,000 mcg by mouth daily.     No current facility-administered medications for this visit.    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  Tobacco Use   Smoking status: Former    Years: 35.00    Types: Cigarettes    Quit date: 09/15/2002    Years since quitting: 19.0   Smokeless tobacco: Never  Vaping Use   Vaping Use: Never used  Substance and Sexual Activity   Alcohol use: Yes    Alcohol/week: 42.0 standard drinks    Types: 21 Cans of beer, 21 Shots of liquor per week    Comment: 6 drinks per day   Drug use: No   Sexual activity: Not Currently    Birth control/protection: None  Other Topics Concern   Not on file  Social History Narrative   Lives with second wife [name], [place] woman   She travels a lot   Retired Armed forces technical officer   No reg exercise   Social Determinants of Radio broadcast assistant Strain: Not on file  Food Insecurity: Not on file  Transportation Needs: Not on file  Physical Activity: Not on file  Stress: Not on file  Social Connections: Not on file    Subjective: Review of Systems  Constitutional:  Negative for chills and fever.  HENT:  Negative for congestion and hearing loss.   Eyes:  Negative for blurred vision and double vision.  Respiratory:  Negative for cough and shortness of breath.   Cardiovascular:  Negative for chest pain and palpitations.  Gastrointestinal:  Positive for heartburn. Negative for abdominal pain, blood in stool, constipation, diarrhea, melena and vomiting.       Numerous loose bowel movements daily  Genitourinary:  Negative for dysuria and urgency.  Musculoskeletal:  Negative for joint pain and myalgias.  Skin:  Negative for itching and rash.  Neurological:  Negative for dizziness and headaches.  Psychiatric/Behavioral:  Negative for depression. The patient is not nervous/anxious.     Objective: BP 139/89   Pulse 94   Temp (!) 97.2 F (36.2  C)   Ht 5\' 6"  (1.676 m)   Wt 237 lb 12.8 oz (107.9 kg)   BMI 38.38 kg/m  Physical Exam Constitutional:      Appearance: Normal appearance.  HENT:     Head: Normocephalic and atraumatic.  Eyes:     Extraocular Movements: Extraocular movements intact.     Conjunctiva/sclera: Conjunctivae normal.  Cardiovascular:     Rate and Rhythm: Normal rate and regular rhythm.  Pulmonary:     Effort: Pulmonary effort is normal.     Breath sounds: Normal breath sounds.  Abdominal:     General: Bowel sounds are normal.  Palpations: Abdomen is soft.  Musculoskeletal:        General: Normal range of motion.     Cervical back: Normal range of motion and neck supple.  Skin:    General: Skin is warm.  Neurological:     General: No focal deficit present.     Mental Status: He is alert and oriented to person, place, and time.  Psychiatric:        Mood and Affect: Mood normal.        Behavior: Behavior normal.     Assessment: *GERD-well-controlled on pantoprazole *Irritable bowel syndrome  Plan: Patient's GERD well-controlled on pantoprazole.  We will continue.  Does not require refills today.  In regards to his bowels, likely this is IBS related.  Also may have a lactose sensitivity which is playing a role as well.  Recommend he continue to avoid lactose as best as he can.  He completed a course of Xifaxan years ago which she states did not help.  I will start him on dicyclomine today and monitor for improvement.  Follow-up in 3 to 4 months or sooner if needed.  10/10/2021 12:58 PM   Disclaimer: This note was dictated with voice recognition software. Similar sounding words can inadvertently be transcribed and may not be corrected upon review.

## 2021-10-22 ENCOUNTER — Other Ambulatory Visit: Payer: Self-pay | Admitting: Gastroenterology

## 2021-11-15 ENCOUNTER — Other Ambulatory Visit: Payer: Self-pay

## 2021-11-15 ENCOUNTER — Ambulatory Visit (INDEPENDENT_AMBULATORY_CARE_PROVIDER_SITE_OTHER): Payer: Medicare Other | Admitting: Orthopedic Surgery

## 2021-11-15 DIAGNOSIS — M7701 Medial epicondylitis, right elbow: Secondary | ICD-10-CM | POA: Diagnosis not present

## 2021-11-15 DIAGNOSIS — M7702 Medial epicondylitis, left elbow: Secondary | ICD-10-CM | POA: Diagnosis not present

## 2021-11-15 NOTE — Progress Notes (Signed)
Chief Complaint  Patient presents with   Elbow Pain    Bilateral Requests injections    Encounter Diagnoses  Name Primary?   Golfer's elbow, right Yes   Golfer's elbow, left     Peter Mora has gotten good relief from the injections about every 6 months  He would like them repeated  He asked me about stem cells and we went through the gamut of injections including stem cells and platelet rich plasma risks and benefits and cost  He wants to proceed with the medial epicondylar injections  Timeout was taken to confirm the injection site right elbow and he gave verbal consent We injected the medial epicondyle at the point of maximal tenderness about her fingers breath distal to it Depo-Medrol 40 mg 1% lidocaine Alcohol prep Ethyl chloride anesthesia  Timeout was taken to confirm injection site of left elbow and he gave verbal consent  We injected the medial epicondyle at the point of maximal tenderness about the fingers breath distal to it we used Depo-Medrol 40 mg with 1% lidocaine one-to-one mixture we used alcohol prep and ethyl chloride chloride anesthesia  There were no complications

## 2021-12-14 ENCOUNTER — Other Ambulatory Visit: Payer: Self-pay | Admitting: Orthopedic Surgery

## 2022-01-14 ENCOUNTER — Encounter: Payer: Self-pay | Admitting: Internal Medicine

## 2022-02-06 DIAGNOSIS — Z85820 Personal history of malignant melanoma of skin: Secondary | ICD-10-CM | POA: Diagnosis not present

## 2022-02-06 DIAGNOSIS — L570 Actinic keratosis: Secondary | ICD-10-CM | POA: Diagnosis not present

## 2022-02-06 DIAGNOSIS — L812 Freckles: Secondary | ICD-10-CM | POA: Diagnosis not present

## 2022-02-06 DIAGNOSIS — L820 Inflamed seborrheic keratosis: Secondary | ICD-10-CM | POA: Diagnosis not present

## 2022-02-11 ENCOUNTER — Ambulatory Visit (INDEPENDENT_AMBULATORY_CARE_PROVIDER_SITE_OTHER): Payer: Medicare Other | Admitting: Orthopedic Surgery

## 2022-02-11 ENCOUNTER — Other Ambulatory Visit: Payer: Self-pay

## 2022-02-11 DIAGNOSIS — M7702 Medial epicondylitis, left elbow: Secondary | ICD-10-CM

## 2022-02-11 DIAGNOSIS — M160 Bilateral primary osteoarthritis of hip: Secondary | ICD-10-CM

## 2022-02-11 DIAGNOSIS — M170 Bilateral primary osteoarthritis of knee: Secondary | ICD-10-CM | POA: Diagnosis not present

## 2022-02-11 MED ORDER — CELECOXIB 200 MG PO CAPS: 200.0000 mg | ORAL_CAPSULE | Freq: Two times a day (BID) | ORAL | 0 refills | Status: DC

## 2022-02-11 NOTE — Patient Instructions (Signed)
Call in 2 weeks to let me know if the medication is working  ? ? ?

## 2022-02-11 NOTE — Progress Notes (Signed)
FOLLOW UP  ? ?Encounter Diagnoses  ?Name Primary?  ? Golfer's elbow, left Yes  ? Arthritis of both hips   ? Arthritis of both knees   ? ? ? ?Chief Complaint  ?Patient presents with  ? Elbow Injury  ?  LT elbow/ wants injection  ? ? ? 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 ?Second problem he complains of soreness in his hips and knees when he gets up in the morning.  This goes away until about 3:00 in the afternoon when he starts to get the symptoms again.  He is on maximum dose of meloxicam 15 mg he takes in the morning ? ?Injection left elbow ? ?Procedure note ? ?Injection ? ?Verbal consent was obtained to inject the medial tennis elbow ? ?Timeout procedure was completed to confirm injection site ? ?Diagnosis medial tennis elbow ? ?Medications used ?Depo-Medrol 40 mg ?Lidocaine 1% plain 3 cc ? ?Anesthesia was provided by ethyl chloride spray ? ?Prep was performed with alcohol ? ?Technique of injection just off the medial epicondyle of the soft tissue was injected on the medial side ? ?No complications were noted ? ? ?I recommend that he try a different medication try Celebrex 200 mg twice a day then call in 2 weeks to see if it is working better than the meloxicam if not then I would recommend trying a different anti-inflammatory ? ? ? ?

## 2022-03-08 ENCOUNTER — Telehealth: Payer: Self-pay

## 2022-03-08 NOTE — Telephone Encounter (Signed)
Patient left voicemail stating that he needs to speak with Dr. Aline Brochure to let him know that the medication is not working. ?Stated Dr. Aline Brochure told him to call and speak with him.. ? ?Please call and advise ? ?[phone] ?

## 2022-03-08 NOTE — Telephone Encounter (Signed)
Spoke with patient. He stated that the Celebrex is not working for him. He is going to re-start his Meloxicam. He still has some at home.  ? ?He stated he would like for you to call him to discuss his situation. Advised him it may be next week before he was contacted, due to provider being in surgery this afternoon. Patient verbalizes understanding and is ok with the time frame.  ?

## 2022-03-12 ENCOUNTER — Telehealth: Payer: Self-pay | Admitting: Orthopedic Surgery

## 2022-03-12 NOTE — Telephone Encounter (Signed)
Call returned - left message.

## 2022-03-16 ENCOUNTER — Other Ambulatory Visit: Payer: Self-pay | Admitting: Orthopedic Surgery

## 2022-04-17 ENCOUNTER — Ambulatory Visit (INDEPENDENT_AMBULATORY_CARE_PROVIDER_SITE_OTHER): Payer: Medicare Other | Admitting: Orthopedic Surgery

## 2022-04-17 DIAGNOSIS — M7702 Medial epicondylitis, left elbow: Secondary | ICD-10-CM | POA: Diagnosis not present

## 2022-04-17 NOTE — Progress Notes (Addendum)
FOLLOW UP   Encounter Diagnoses  Name Primary?   Golfer's elbow, left Yes   Golfer's elbow, right      Chief Complaint  Patient presents with   Elbow Pain    LT elbow/ had injection at previous visit.Marland Kitchen it didn't help this time. Elbow now keeping him awake. Feels like a toothache     76 year old active male complains of bilateral elbow pain in the past recently received a left elbow injection for golfers elbow.  The patient was advised to try Celebrex 200 mg twice a day but it seems like the medication did not work and he went back to his meloxicam  Meloxicam helped the hips and knees   He comes in today complaining of medial left elbow pain   Exam: tender medial epicondyle   Options:  Activity modification says he's not working that much   Repeat injection option as well  Physical therapy patient opts for no therapy   Surgery: he says he wants hold on the surgery and try another injection    He wants to wait on surgery and try injection.  Procedure note injection for left golfers elbow  Diagnosis left golfers elbow  Anesthesia ethyl chloride was used Alcohol use is clean the skin  After we obtained verbal consent and timeout a 25-gauge needle was used to inject 40 mg of Depo-Medrol and 3 cc of 1% lidocaine medial epicondyle  There were no complications and a sterile bandage was applied.   Fu 5-7 wks

## 2022-04-29 ENCOUNTER — Ambulatory Visit: Payer: Medicare Other | Admitting: Orthopedic Surgery

## 2022-05-29 ENCOUNTER — Ambulatory Visit (INDEPENDENT_AMBULATORY_CARE_PROVIDER_SITE_OTHER): Payer: Medicare Other | Admitting: Orthopedic Surgery

## 2022-05-29 DIAGNOSIS — M7702 Medial epicondylitis, left elbow: Secondary | ICD-10-CM | POA: Diagnosis not present

## 2022-05-29 NOTE — Progress Notes (Signed)
FOLLOW UP   Encounter Diagnosis  Name Primary?   Golfer's elbow, left Yes     Chief Complaint  Patient presents with   Elbow Pain    LT elbow/ has had slight improvement/ is interested in another injection    Peter Mora is doing well he improved with his last injection 6 weeks ago he would like another  Again he is not interested in having surgery at this time  We reexamined his elbow he has tenderness about 1 finger breath distal to the medial epicondyle consistent with golfers elbow  He enjoys woodworking and this is the source of his pain but he is not willing to give that up understandably.  Repeat injection left elbow  Encounter Diagnosis  Name Primary?   Golfer's elbow, left Yes    A steroid injection was performed at medial epicondyle left elbow  using 1% plain Lidocaine and 40 mg of depomedrol. This was well tolerated.

## 2022-05-29 NOTE — Patient Instructions (Signed)

## 2022-06-06 ENCOUNTER — Other Ambulatory Visit: Payer: Self-pay | Admitting: Orthopedic Surgery

## 2022-08-13 DIAGNOSIS — D1801 Hemangioma of skin and subcutaneous tissue: Secondary | ICD-10-CM | POA: Diagnosis not present

## 2022-08-13 DIAGNOSIS — Z85820 Personal history of malignant melanoma of skin: Secondary | ICD-10-CM | POA: Diagnosis not present

## 2022-08-13 DIAGNOSIS — L570 Actinic keratosis: Secondary | ICD-10-CM | POA: Diagnosis not present

## 2022-08-13 DIAGNOSIS — I788 Other diseases of capillaries: Secondary | ICD-10-CM | POA: Diagnosis not present

## 2022-08-13 DIAGNOSIS — L814 Other melanin hyperpigmentation: Secondary | ICD-10-CM | POA: Diagnosis not present

## 2022-08-13 DIAGNOSIS — L820 Inflamed seborrheic keratosis: Secondary | ICD-10-CM | POA: Diagnosis not present

## 2022-08-15 ENCOUNTER — Ambulatory Visit (INDEPENDENT_AMBULATORY_CARE_PROVIDER_SITE_OTHER): Payer: Medicare Other | Admitting: Orthopedic Surgery

## 2022-08-15 DIAGNOSIS — M7702 Medial epicondylitis, left elbow: Secondary | ICD-10-CM

## 2022-08-15 MED ORDER — METHYLPREDNISOLONE ACETATE 40 MG/ML IJ SUSP
40.0000 mg | Freq: Once | INTRAMUSCULAR | Status: AC
  Administered 2022-08-15: 40 mg via INTRA_ARTICULAR

## 2022-08-15 NOTE — Progress Notes (Signed)
Chief Complaint  Patient presents with   pain left elbow   Recurrent pain medial left elbow  Patient request injection left elbow  A steroid injection was performed at medial epicondyle left elbow using 1% plain Lidocaine and milligrams of Depo-Medrol  No complications  Encounter Diagnosis  Name Primary?   Golfer's elbow, left Yes

## 2022-09-02 ENCOUNTER — Other Ambulatory Visit: Payer: Self-pay | Admitting: Orthopedic Surgery

## 2022-10-07 ENCOUNTER — Other Ambulatory Visit: Payer: Self-pay | Admitting: Gastroenterology

## 2022-11-01 ENCOUNTER — Encounter: Payer: Self-pay | Admitting: Orthopedic Surgery

## 2022-11-01 ENCOUNTER — Ambulatory Visit (INDEPENDENT_AMBULATORY_CARE_PROVIDER_SITE_OTHER): Payer: Medicare Other | Admitting: Orthopedic Surgery

## 2022-11-01 VITALS — Ht 66.0 in | Wt 237.0 lb

## 2022-11-01 DIAGNOSIS — M7702 Medial epicondylitis, left elbow: Secondary | ICD-10-CM

## 2022-11-01 DIAGNOSIS — M7701 Medial epicondylitis, right elbow: Secondary | ICD-10-CM | POA: Diagnosis not present

## 2022-11-01 MED ORDER — METHYLPREDNISOLONE ACETATE 40 MG/ML IJ SUSP
40.0000 mg | Freq: Once | INTRAMUSCULAR | Status: AC
  Administered 2022-11-01: 40 mg via INTRA_ARTICULAR

## 2022-11-01 NOTE — Progress Notes (Signed)
Chief Complaint  Patient presents with   Injections    Bil elbow injections    Encounter Diagnoses  Name Primary?   Golfer's elbow, left Yes   Golfer's elbow, right    The patient has requested an injection  Complaint lateral elbow pain medially  Diagnosis golfers elbow medial epicondylitis bilateral  After appropriate timeout for site confirmation medication confirmation,  The right and left elbow was prepped with alcohol and ethyl chloride spray.  The injection was performed at the 1 fingerbreadths distal to the medial epicondyle this was done on the right and repeated on the left  Medication Depomedrol 40 mg and 1% lidocaine plain   There were no complications  The patient was observed for any reactions there were none and the patient was discharged.

## 2022-12-09 ENCOUNTER — Other Ambulatory Visit: Payer: Self-pay | Admitting: Orthopedic Surgery

## 2022-12-16 DIAGNOSIS — U071 COVID-19: Secondary | ICD-10-CM | POA: Diagnosis not present

## 2023-01-16 ENCOUNTER — Encounter: Payer: Self-pay | Admitting: Radiology

## 2023-02-11 DIAGNOSIS — L570 Actinic keratosis: Secondary | ICD-10-CM | POA: Diagnosis not present

## 2023-02-11 DIAGNOSIS — L821 Other seborrheic keratosis: Secondary | ICD-10-CM | POA: Diagnosis not present

## 2023-02-11 DIAGNOSIS — Z85820 Personal history of malignant melanoma of skin: Secondary | ICD-10-CM | POA: Diagnosis not present

## 2023-02-11 DIAGNOSIS — L820 Inflamed seborrheic keratosis: Secondary | ICD-10-CM | POA: Diagnosis not present

## 2023-02-11 DIAGNOSIS — L812 Freckles: Secondary | ICD-10-CM | POA: Diagnosis not present

## 2023-03-04 ENCOUNTER — Other Ambulatory Visit: Payer: Self-pay | Admitting: Orthopedic Surgery

## 2023-04-17 ENCOUNTER — Encounter: Payer: Self-pay | Admitting: Orthopedic Surgery

## 2023-04-17 ENCOUNTER — Ambulatory Visit (INDEPENDENT_AMBULATORY_CARE_PROVIDER_SITE_OTHER): Payer: Medicare Other | Admitting: Orthopedic Surgery

## 2023-04-17 DIAGNOSIS — M7702 Medial epicondylitis, left elbow: Secondary | ICD-10-CM

## 2023-04-17 MED ORDER — METHYLPREDNISOLONE ACETATE 40 MG/ML IJ SUSP
40.0000 mg | Freq: Once | INTRAMUSCULAR | Status: AC
  Administered 2023-04-17: 40 mg via INTRA_ARTICULAR

## 2023-04-17 NOTE — Progress Notes (Signed)
Peter Mora comes in today for injection left elbow chronic golfers elbow  He does well with the injections.  Point tenderness is noted 1 fingerbreadth in front of the medial epicondyle   Time out in site confirmation performed  Left elbow medial epicondyle This area is anesthetized with ethyl chloride we cleaned it with alcohol we injected a one-to-one mixture of Depo-Medrol and lidocaine this went well  Encounter Diagnosis  Name Primary?   Golfer's elbow, left Yes

## 2023-05-30 ENCOUNTER — Other Ambulatory Visit: Payer: Self-pay | Admitting: Orthopedic Surgery

## 2023-07-30 DIAGNOSIS — Z85820 Personal history of malignant melanoma of skin: Secondary | ICD-10-CM | POA: Diagnosis not present

## 2023-07-30 DIAGNOSIS — L820 Inflamed seborrheic keratosis: Secondary | ICD-10-CM | POA: Diagnosis not present

## 2023-07-30 DIAGNOSIS — D1801 Hemangioma of skin and subcutaneous tissue: Secondary | ICD-10-CM | POA: Diagnosis not present

## 2023-07-30 DIAGNOSIS — L812 Freckles: Secondary | ICD-10-CM | POA: Diagnosis not present

## 2023-07-30 DIAGNOSIS — L570 Actinic keratosis: Secondary | ICD-10-CM | POA: Diagnosis not present

## 2023-07-30 DIAGNOSIS — D2272 Melanocytic nevi of left lower limb, including hip: Secondary | ICD-10-CM | POA: Diagnosis not present

## 2023-07-30 DIAGNOSIS — D225 Melanocytic nevi of trunk: Secondary | ICD-10-CM | POA: Diagnosis not present

## 2023-08-28 ENCOUNTER — Ambulatory Visit: Payer: Medicare Other | Admitting: Orthopedic Surgery

## 2023-08-28 ENCOUNTER — Other Ambulatory Visit (INDEPENDENT_AMBULATORY_CARE_PROVIDER_SITE_OTHER): Payer: Self-pay

## 2023-08-28 DIAGNOSIS — M1712 Unilateral primary osteoarthritis, left knee: Secondary | ICD-10-CM

## 2023-08-28 DIAGNOSIS — G8929 Other chronic pain: Secondary | ICD-10-CM

## 2023-08-28 DIAGNOSIS — M7702 Medial epicondylitis, left elbow: Secondary | ICD-10-CM

## 2023-08-28 MED ORDER — METHYLPREDNISOLONE ACETATE 40 MG/ML IJ SUSP
40.0000 mg | Freq: Once | INTRAMUSCULAR | Status: AC
  Administered 2023-08-28: 40 mg via INTRA_ARTICULAR

## 2023-08-28 NOTE — Progress Notes (Signed)
Established patient  Recurrent problem left elbow pain  New problem left knee pain status post knee arthroscopy left knee medial meniscectomy 2018  Recurrent pain left elbow medial aspect with medial epicondylitis as well injections  Tenderness over the medial of the condyle recommend injection done  Pain left knee progressively worsening left knee pain no loss of motion notes some functional deficit  May have small effusion in the tail of the stable  He does have edema  Imaging shows grade 3 arthritis medial femoral condyle with 80% loss of joint space and some mild osteophytes.  Notable patellofemoral bone-on-bone changes as well on 1 side of the joint  Recommend knee replacement when patient is symptomatic enough to consider.  Procedure  A steroid injection was performed at medial epicondyle using 1% plain Lidocaine and 40 mg of Depo-Medrol. This was well tolerated.

## 2023-08-29 ENCOUNTER — Encounter: Payer: Medicare Other | Admitting: Orthopedic Surgery

## 2023-08-29 ENCOUNTER — Telehealth: Payer: Self-pay | Admitting: Internal Medicine

## 2023-08-29 NOTE — Telephone Encounter (Signed)
Need new patient approval

## 2023-08-30 ENCOUNTER — Other Ambulatory Visit: Payer: Self-pay | Admitting: Orthopedic Surgery

## 2023-09-02 NOTE — Telephone Encounter (Signed)
scheduled

## 2023-10-03 ENCOUNTER — Ambulatory Visit (INDEPENDENT_AMBULATORY_CARE_PROVIDER_SITE_OTHER): Payer: Medicare Other | Admitting: Internal Medicine

## 2023-10-03 ENCOUNTER — Encounter: Payer: Self-pay | Admitting: Internal Medicine

## 2023-10-03 VITALS — BP 146/82 | HR 88 | Ht 66.0 in | Wt 242.0 lb

## 2023-10-03 DIAGNOSIS — J321 Chronic frontal sinusitis: Secondary | ICD-10-CM | POA: Diagnosis not present

## 2023-10-03 DIAGNOSIS — F1020 Alcohol dependence, uncomplicated: Secondary | ICD-10-CM | POA: Diagnosis not present

## 2023-10-03 DIAGNOSIS — M19029 Primary osteoarthritis, unspecified elbow: Secondary | ICD-10-CM | POA: Insufficient documentation

## 2023-10-03 DIAGNOSIS — M1712 Unilateral primary osteoarthritis, left knee: Secondary | ICD-10-CM | POA: Insufficient documentation

## 2023-10-03 DIAGNOSIS — R739 Hyperglycemia, unspecified: Secondary | ICD-10-CM

## 2023-10-03 DIAGNOSIS — E559 Vitamin D deficiency, unspecified: Secondary | ICD-10-CM

## 2023-10-03 DIAGNOSIS — Z91018 Allergy to other foods: Secondary | ICD-10-CM

## 2023-10-03 DIAGNOSIS — E782 Mixed hyperlipidemia: Secondary | ICD-10-CM | POA: Insufficient documentation

## 2023-10-03 DIAGNOSIS — I10 Essential (primary) hypertension: Secondary | ICD-10-CM

## 2023-10-03 DIAGNOSIS — R194 Change in bowel habit: Secondary | ICD-10-CM

## 2023-10-03 DIAGNOSIS — K219 Gastro-esophageal reflux disease without esophagitis: Secondary | ICD-10-CM

## 2023-10-03 MED ORDER — AMOXICILLIN-POT CLAVULANATE 875-125 MG PO TABS: 1.0000 | ORAL_TABLET | Freq: Two times a day (BID) | ORAL | 0 refills | Status: DC

## 2023-10-03 MED ORDER — FLUTICASONE PROPIONATE 50 MCG/ACT NA SUSP: 2.0000 | Freq: Every day | NASAL | 1 refills | Status: DC

## 2023-10-03 NOTE — Patient Instructions (Signed)
Please start taking Augmentin as prescribed and use Flonase for nasal congestion.  Please follow DASH diet and perform moderate exercise/walking as tolerated.

## 2023-10-03 NOTE — Assessment & Plan Note (Signed)
BP Readings from Last 1 Encounters:  10/03/23 (!) 147/87   Needs to cut down alcohol use If persistently elevated BP, will start ARB Advised DASH diet and moderate exercise/walking, at least 150 mins/week

## 2023-10-03 NOTE — Assessment & Plan Note (Signed)
Overall well controlled with pantoprazole 40 mg QD Advised to reduce NSAID use

## 2023-10-03 NOTE — Assessment & Plan Note (Signed)
Followed by orthopedic surgery for elbow arthritis and golfers elbow Takes meloxicam 15 mg QD Advised to alternate meloxicam with Tylenol arthritis as he has history of GERD

## 2023-10-03 NOTE — Progress Notes (Signed)
New Patient Office Visit  Subjective:  Patient ID: Peter Mora, male    DOB: [birth year]  Age: 77 y.o. MRN: [id number]  CC:  Chief Complaint  Patient presents with   Establish Care    HPI Peter Mora is a 77 y.o. male with past medical history of GERD, IBS and obesity who presents for establishing care.  GERD/gastritis and IBS: He takes pantoprazole 40 mg QD for it.  Denies any nausea or vomiting currently.  He reports about 7-8 BM daily in the morning, which are not loose, but has stool urgency.  Has been followed by GI.  He has been told of lactose intolerance and tries to avoid lactose containing food.  He reports that he likely has other food allergies as well and wants allergy testing done.  Chronic sinusitis: He has chronic nasal congestion, and has had Augmentin prescribed by his dentist in the past.  He has dry cough, postnasal drip and throat irritation, but denies any fever, chills, dyspnea or wheezing currently.  He works with wood, and tries to avoid direct wood dust exposure by wearing mask.  OA of knee and elbow: Followed by orthopedic surgery.  He gets steroid injection in the elbow for it.  He currently takes meloxicam 15 mg once daily.  Alcohol dependence: He takes about 3-5 drinks of whiskey and 2 beers almost daily.  He had tried to discontinue whiskey on Labor Day, but started having severe leg pain 6 weeks later.  He restarted taking whiskey and felt improvement in his leg pain.  He agrees to cut down alcoholic drinks and continue multivitamin supplement for now.  Obesity: He has lost about 15 lbs in the last 6 weeks with diet modification.  He has chronic snoring, but denies any daytime fatigue.  He attributes snoring and sleep disturbance to alcohol use.  He has had sleep study in the lab, which was unequivocal and he did not opt to have repeat test.  Past Medical History:  Diagnosis Date   Arthritis    in both knees   Sleep apnea     Past  Surgical History:  Procedure Laterality Date   BACK SURGERY     excision of melanoma   BIOPSY  04/07/2018   Procedure: BIOPSY;  Surgeon: West Bali, MD;  [facility];  Service: Endoscopy;;  random colon biopsy   BIOPSY  11/23/2019   Procedure: BIOPSY;  Surgeon: West Bali, MD;  [facility]: [facility];  Service: Endoscopy;;  duodenal bulb, second portion of duodenal , duodenal nodule    COLONOSCOPY WITH PROPOFOL N/A 04/07/2018   one 4 mm hyperplastic rectal polyp, redundant left colon, external and internal hemorrhoids   ESOPHAGOGASTRODUODENOSCOPY (EGD) WITH PROPOFOL N/A 04/07/2018   Negative Barrett's. medium sized hiatal hernia. moderate gastritis/duodenitis   ESOPHAGOGASTRODUODENOSCOPY (EGD) WITH PROPOFOL N/A 11/23/2019   Procedure: ESOPHAGOGASTRODUODENOSCOPY (EGD) WITH PROPOFOL;  Surgeon: West Bali, MD;  [facility];  Service: Endoscopy;  Laterality: N/A;  11:15am   KNEE ARTHROSCOPY WITH MEDIAL MENISECTOMY Left 10/02/2017   Procedure: LEFT KNEE ARTHROSCOPY WITH PARTIAL MEDIAL MENISECTOMY;  Surgeon: Vickki Hearing, MD;  Location: [facility];  Service: Orthopedics;  Laterality: Left;   POLYPECTOMY  04/07/2018   Procedure: POLYPECTOMY;  Surgeon: West Bali, MD;  [facility];  Service: Endoscopy;;  rectal polyp cs    Family History  Problem Relation Age of Onset   Cancer Mother  esophageal   Cancer Sister        colon and breast    Cancer Brother        colon   Cancer Father 56       agressive lung cancer    Social History   Socioeconomic History   Marital status: Single    Spouse name: [name]   Number of children: 3   Years of education: 16   Highest education level: Bachelor's degree (e.g., BA, AB, BS)  Occupational History   Occupation: woodworker    Comment: retired   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  Tobacco Use   Smoking status: Former    Current packs/day: 0.00    Types: Cigarettes    Start date:  09/16/1967    Quit date: 09/15/2002    Years since quitting: 21.0   Smokeless tobacco: Never  Vaping Use   Vaping status: Never Used  Substance and Sexual Activity   Alcohol use: Yes    Alcohol/week: 42.0 standard drinks of alcohol    Types: 21 Cans of beer, 21 Shots of liquor per week    Comment: 6 drinks per day   Drug use: No   Sexual activity: Not Currently    Birth control/protection: None  Other Topics Concern   Not on file  Social History Narrative   Lives with second wife Burnett Harry, [place] woman   She travels a lot   Retired Printmaker   No reg exercise   Social Determinants of Corporate investment banker Strain: Not on file  Food Insecurity: Not on file  Transportation Needs: Not on file  Physical Activity: Not on file  Stress: Not on file  Social Connections: Not on file  Intimate Partner Violence: Not on file    ROS Review of Systems  Constitutional:  Negative for chills and fever.  HENT:  Negative for congestion and sore throat.   Eyes:  Negative for pain and discharge.  Respiratory:  Negative for cough and shortness of breath.   Cardiovascular:  Negative for chest pain and palpitations.  Gastrointestinal:  Negative for diarrhea, nausea and vomiting.  Endocrine: Negative for polydipsia and polyuria.  Genitourinary:  Negative for dysuria and hematuria.  Musculoskeletal:  Positive for arthralgias (L elbow and left knee). Negative for neck pain and neck stiffness.  Skin:  Negative for rash.  Neurological:  Negative for dizziness, weakness, numbness and headaches.  Psychiatric/Behavioral:  Negative for agitation and behavioral problems.     Objective:   Today's Vitals: BP (!) 146/82 (BP Location: Right Arm)   Pulse 88   Ht 5\' 6"  (1.676 m)   Wt 242 lb (109.8 kg)   SpO2 94%   BMI 39.06 kg/m   Physical Exam Vitals reviewed.  Constitutional:      General: He is not in acute distress.    Appearance: He is not diaphoretic.  HENT:     Head:  Normocephalic and atraumatic.     Nose: Nose normal.     Mouth/Throat:     Mouth: Mucous membranes are moist.  Eyes:     General: No scleral icterus.    Extraocular Movements: Extraocular movements intact.  Cardiovascular:     Rate and Rhythm: Normal rate and regular rhythm.     Heart sounds: Normal heart sounds. No murmur heard. Pulmonary:     Breath sounds: Normal breath sounds. No wheezing or rales.  Musculoskeletal:     Cervical back: Neck supple. No tenderness.     Right lower  leg: No edema.     Left lower leg: No edema.  Skin:    General: Skin is warm.     Findings: No rash.  Neurological:     General: No focal deficit present.     Mental Status: He is alert and oriented to person, place, and time.  Psychiatric:        Mood and Affect: Mood normal.        Behavior: Behavior normal.     Assessment & Plan:   Problem List Items Addressed This Visit       Cardiovascular and Mediastinum   Elevated blood pressure reading with diagnosis of hypertension    BP Readings from Last 1 Encounters:  10/03/23 (!) 147/87   Needs to cut down alcohol use If persistently elevated BP, will start ARB Advised DASH diet and moderate exercise/walking, at least 150 mins/week       Relevant Orders   TSH     Respiratory   Chronic frontal sinusitis - Primary    Considering recent worsening of nasal drainage, started Augmentin Prescribed Flonase for allergies Referred to allergy clinic for allergy testing      Relevant Medications   amoxicillin-clavulanate (AUGMENTIN) 875-125 MG tablet   fluticasone (FLONASE) 50 MCG/ACT nasal spray   Other Relevant Orders   Ambulatory referral to Allergy     Digestive   GERD (gastroesophageal reflux disease)    Overall well controlled with pantoprazole 40 mg QD Advised to reduce NSAID use        Musculoskeletal and Integument   Elbow arthritis    Followed by orthopedic surgery for elbow arthritis and golfers elbow Takes meloxicam 15 mg  QD Advised to alternate meloxicam with Tylenol arthritis as he has history of GERD      Primary osteoarthritis of left knee    Followed by Orthopedic surgery On Meloxicam 15 mg once daily, advised to alternate with Tylenol arthritis        Other   Change in bowel habits    Has 7-8 BMs daily, but denies loose or watery BM Has been evaluated by GI His symptoms are suggestive of IBS He also believes he has food allergies-refer to allergy clinic      Mixed hyperlipidemia    Check lipid profile Advised to follow DASH diet      Relevant Orders   Lipid panel   Uncomplicated alcohol dependence (HCC)    Takes 3-5 drinks of whiskey and 2 beers daily Needs to cut down slowly on her drinks and limit maximum up to 2 drinks per day He believes he does not need any medicine or support group, which I agree -self-motivated. Continue multivitamin - B complex vitamins Check CBC, CMP, TSH, vitamin  B1 and B12      Relevant Orders   TSH   CMP14+EGFR   CBC with Differential/Platelet   B12   Vitamin B1   Other Visit Diagnoses     Hyperglycemia       Relevant Orders   Hemoglobin A1c   Vitamin D deficiency       Relevant Orders   VITAMIN D 25 Hydroxy (Vit-D Deficiency, Fractures)   Multiple food allergies       Relevant Orders   Ambulatory referral to Allergy       Outpatient Encounter Medications as of 10/03/2023  Medication Sig   amoxicillin-clavulanate (AUGMENTIN) 875-125 MG tablet Take 1 tablet by mouth 2 (two) times daily.   CALCIUM-MAGNESIUM-ZINC PO Take by mouth daily.  cholecalciferol (VITAMIN D3) 25 MCG (1000 UT) tablet Take 1,000 Units by mouth daily.   fluticasone (FLONASE) 50 MCG/ACT nasal spray Place 2 sprays into both nostrils daily.   meloxicam (MOBIC) 15 MG tablet Take 1 tablet by mouth once daily   Multiple Vitamin (MULTIVITAMIN WITH MINERALS) TABS tablet Take 1 tablet by mouth daily.   pantoprazole (PROTONIX) 40 MG tablet TAKE 1 TABLET BY MOUTH ONCE DAILY 30  MINUTES  BEFORE BREAKFAST   Potassium (POTASSIMIN PO) Take by mouth daily.   vitamin B-12 (CYANOCOBALAMIN) 1000 MCG tablet Take 1,000 mcg by mouth daily.   No facility-administered encounter medications on file as of 10/03/2023.    Follow-up: Return in about 2 months (around 12/03/2023) for Blood pressure check.   Anabel Halon, MD

## 2023-10-03 NOTE — Assessment & Plan Note (Signed)
Takes 3-5 drinks of whiskey and 2 beers daily Needs to cut down slowly on her drinks and limit maximum up to 2 drinks per day He believes he does not need any medicine or support group, which I agree -self-motivated. Continue multivitamin - B complex vitamins Check CBC, CMP, TSH, vitamin  B1 and B12

## 2023-10-03 NOTE — Assessment & Plan Note (Signed)
Followed by Orthopedic surgery On Meloxicam 15 mg once daily, advised to alternate with Tylenol arthritis

## 2023-10-03 NOTE — Assessment & Plan Note (Signed)
Check lipid profile Advised to follow DASH diet 

## 2023-10-03 NOTE — Assessment & Plan Note (Signed)
Considering recent worsening of nasal drainage, started Augmentin Prescribed Flonase for allergies Referred to allergy clinic for allergy testing

## 2023-10-03 NOTE — Assessment & Plan Note (Signed)
Has 7-8 BMs daily, but denies loose or watery BM Has been evaluated by GI His symptoms are suggestive of IBS He also believes he has food allergies-refer to allergy clinic

## 2023-10-10 LAB — CBC WITH DIFFERENTIAL/PLATELET
Basophils Absolute: 0 10*3/uL (ref 0.0–0.2)
Basos: 1 %
EOS (ABSOLUTE): 0.3 10*3/uL (ref 0.0–0.4)
Eos: 5 %
Hematocrit: 47.2 % (ref 37.5–51.0)
Hemoglobin: 16 g/dL (ref 13.0–17.7)
Immature Grans (Abs): 0 10*3/uL (ref 0.0–0.1)
Immature Granulocytes: 0 %
Lymphocytes Absolute: 1.1 10*3/uL (ref 0.7–3.1)
Lymphs: 19 %
MCH: 31.3 pg (ref 26.6–33.0)
MCHC: 33.9 g/dL (ref 31.5–35.7)
MCV: 92 fL (ref 79–97)
Monocytes Absolute: 0.6 10*3/uL (ref 0.1–0.9)
Monocytes: 11 %
Neutrophils Absolute: 3.8 10*3/uL (ref 1.4–7.0)
Neutrophils: 64 %
Platelets: 177 10*3/uL (ref 150–450)
RBC: 5.12 x10E6/uL (ref 4.14–5.80)
RDW: 13.2 % (ref 11.6–15.4)
WBC: 5.9 10*3/uL (ref 3.4–10.8)

## 2023-10-10 LAB — LIPID PANEL
Chol/HDL Ratio: 2.3 ratio (ref 0.0–5.0)
Cholesterol, Total: 194 mg/dL (ref 100–199)
HDL: 85 mg/dL (ref >39)
LDL Chol Calc (NIH): 91 mg/dL (ref 0–99)
Triglycerides: 102 mg/dL (ref 0–149)
VLDL Cholesterol Cal: 18 mg/dL (ref 5–40)

## 2023-10-10 LAB — CMP14+EGFR
ALT: 29 [IU]/L (ref 0–44)
AST: 23 [IU]/L (ref 0–40)
Albumin: 4.5 g/dL (ref 3.8–4.8)
Alkaline Phosphatase: 71 [IU]/L (ref 44–121)
BUN/Creatinine Ratio: 33 — ABNORMAL HIGH (ref 10–24)
BUN: 27 mg/dL (ref 8–27)
Bilirubin Total: 0.7 mg/dL (ref 0.0–1.2)
CO2: 24 mmol/L (ref 20–29)
Calcium: 9 mg/dL (ref 8.6–10.2)
Chloride: 104 mmol/L (ref 96–106)
Creatinine, Ser: 0.81 mg/dL (ref 0.76–1.27)
Globulin, Total: 2.2 g/dL (ref 1.5–4.5)
Glucose: 68 mg/dL — ABNORMAL LOW (ref 70–99)
Potassium: 4.4 mmol/L (ref 3.5–5.2)
Sodium: 140 mmol/L (ref 134–144)
Total Protein: 6.7 g/dL (ref 6.0–8.5)
eGFR: 92 mL/min/{1.73_m2} (ref >59)

## 2023-10-10 LAB — VITAMIN B12: Vitamin B-12: 659 pg/mL (ref 232–1245)

## 2023-10-10 LAB — VITAMIN B1: Thiamine: 154.8 nmol/L (ref 66.5–200.0)

## 2023-10-10 LAB — TSH: TSH: 2.36 u[IU]/mL (ref 0.450–4.500)

## 2023-10-10 LAB — VITAMIN D 25 HYDROXY (VIT D DEFICIENCY, FRACTURES): Vit D, 25-Hydroxy: 54.4 ng/mL (ref 30.0–100.0)

## 2023-10-10 LAB — HEMOGLOBIN A1C
Est. average glucose Bld gHb Est-mCnc: 111 mg/dL
Hgb A1c MFr Bld: 5.5 % (ref 4.8–5.6)

## 2023-10-13 ENCOUNTER — Encounter: Payer: Self-pay | Admitting: Orthopedic Surgery

## 2023-10-15 ENCOUNTER — Other Ambulatory Visit: Payer: Self-pay | Admitting: Orthopedic Surgery

## 2023-10-15 DIAGNOSIS — M1712 Unilateral primary osteoarthritis, left knee: Secondary | ICD-10-CM

## 2023-10-15 DIAGNOSIS — Z01818 Encounter for other preprocedural examination: Secondary | ICD-10-CM

## 2023-10-15 MED ORDER — BUPIVACAINE-MELOXICAM ER 400-12 MG/14ML IJ SOLN: 400.0000 mg | Freq: Once | INTRAMUSCULAR | Status: DC

## 2023-10-15 NOTE — Addendum Note (Signed)
Addended byCaffie Damme on: 10/15/2023 11:39 AM   Modules accepted: Orders

## 2023-10-23 ENCOUNTER — Encounter: Payer: Self-pay | Admitting: Internal Medicine

## 2023-11-17 ENCOUNTER — Ambulatory Visit (INDEPENDENT_AMBULATORY_CARE_PROVIDER_SITE_OTHER): Payer: Medicare Other | Admitting: Internal Medicine

## 2023-11-17 ENCOUNTER — Other Ambulatory Visit: Payer: Self-pay

## 2023-11-17 ENCOUNTER — Encounter: Payer: Self-pay | Admitting: Internal Medicine

## 2023-11-17 VITALS — BP 125/78 | HR 79 | Temp 98.2°F | Resp 18 | Ht 66.34 in | Wt 246.6 lb

## 2023-11-17 DIAGNOSIS — K5229 Other allergic and dietetic gastroenteritis and colitis: Secondary | ICD-10-CM | POA: Diagnosis not present

## 2023-11-17 DIAGNOSIS — J3089 Other allergic rhinitis: Secondary | ICD-10-CM

## 2023-11-17 DIAGNOSIS — K219 Gastro-esophageal reflux disease without esophagitis: Secondary | ICD-10-CM

## 2023-11-17 NOTE — Progress Notes (Signed)
NEW PATIENT  Date of Service/Encounter:  11/17/23  Consult requested by: Anabel Halon, MD   Subjective:   Peter Mora (DOB: 15-Jun-1946) is a 77 y.o. male who presents to the clinic on 11/17/2023 with a chief complaint of New Patient (Initial Visit) (Sinus and allergic reaction) .    History obtained from: chart review and patient.   Rhinitis:  Started about 1.5 -2 years ago. Started after a root canal issues.  Seen by dentist multiple times after but panoramic xray is clear.   Symptoms include: nasal congestion, rhinorrhea, and post nasal drainage  Occurs year-round Potential triggers: not sure  Treatments tried:  Augmentin  Flonase Claritin PRN  Previous allergy testing: no History of sinus surgery: no Nonallergic triggers: none   GERD/Diarrhea: Followed by GI, controlled reflux on PPI daily. Not much heartburn/sour taste in mouth/AM cough. Does have trouble with frequent BM in early AM.  Thinks he has lactose intolerance.   Previously underwent food sensitivity panel but can't recall results.   Reviewed:  10/03/2023: seen by Dr Meleni Delahunt for GERD/IBS/gastritis on PPI, chronic sinusitis on Flonase, started Augmentin.   Also worried about food allergies due to GI issues. Referred to Allergy.   10/10/2021: seen by GI Dr Marletta Lor for GERD and IBS.  On PPI.   11/29/2019: seen by Cardiology Dr Purvis Sheffield for PVCs. Asymptomatic.  No further workup.   Past Medical History: Past Medical History:  Diagnosis Date   Arthritis    in both knees   Sleep apnea     Past Surgical History: Past Surgical History:  Procedure Laterality Date   BACK SURGERY     excision of melanoma   BIOPSY  04/07/2018   Procedure: BIOPSY;  Surgeon: West Bali, MD;  [facility];  Service: Endoscopy;;  random colon biopsy   BIOPSY  11/23/2019   Procedure: BIOPSY;  Surgeon: West Bali, MD;  [facility];  Service: Endoscopy;;  duodenal bulb, second portion of  duodenal , duodenal nodule    COLONOSCOPY WITH PROPOFOL N/A 04/07/2018   one 4 mm hyperplastic rectal polyp, redundant left colon, external and internal hemorrhoids   ESOPHAGOGASTRODUODENOSCOPY (EGD) WITH PROPOFOL N/A 04/07/2018   Negative Barrett's. medium sized hiatal hernia. moderate gastritis/duodenitis   ESOPHAGOGASTRODUODENOSCOPY (EGD) WITH PROPOFOL N/A 11/23/2019   Procedure: ESOPHAGOGASTRODUODENOSCOPY (EGD) WITH PROPOFOL;  Surgeon: West Bali, MD;  [facility];  Service: Endoscopy;  Laterality: N/A;  11:15am   KNEE ARTHROSCOPY WITH MEDIAL MENISECTOMY Left 10/02/2017   Procedure: LEFT KNEE ARTHROSCOPY WITH PARTIAL MEDIAL MENISECTOMY;  Surgeon: Vickki Hearing, MD;  Location: [facility];  Service: Orthopedics;  Laterality: Left;   POLYPECTOMY  04/07/2018   Procedure: POLYPECTOMY;  Surgeon: West Bali, MD;  [facility];  Service: Endoscopy;;  rectal polyp cs    Family History: Family History  Problem Relation Age of Onset   Cancer Mother        esophageal   Cancer Sister        colon and breast    Cancer Brother        colon   Cancer Father 69       agressive lung cancer    Social History:  Flooring in bedroom: carpet Pets: none Tobacco use/exposure: quit 2003, 1 pack per day,not sure how many years Job: retired; woodworking  Medication List:  Allergies as of 11/17/2023   No Known Allergies      Medication List  Accurate as of November 17, 2023  2:49 PM. If you have any questions, ask your nurse or doctor.          amoxicillin-clavulanate 875-125 MG tablet Commonly known as: AUGMENTIN Take 1 tablet by mouth 2 (two) times daily.   CALCIUM-MAGNESIUM-ZINC PO Take by mouth daily.   cholecalciferol 25 MCG (1000 UNIT) tablet Commonly known as: VITAMIN D3 Take 1,000 Units by mouth daily.   cyanocobalamin 1000 MCG tablet Commonly known as: VITAMIN B12 Take 1,000 mcg by mouth daily.   fluticasone 50 MCG/ACT nasal  spray Commonly known as: FLONASE Place 2 sprays into both nostrils daily.   meloxicam 15 MG tablet Commonly known as: MOBIC Take 1 tablet by mouth once daily   multivitamin with minerals Tabs tablet Take 1 tablet by mouth daily.   pantoprazole 40 MG tablet Commonly known as: PROTONIX TAKE 1 TABLET BY MOUTH ONCE DAILY 30 MINUTES  BEFORE BREAKFAST   POTASSIMIN PO Take by mouth daily.         REVIEW OF SYSTEMS: Pertinent positives and negatives discussed in HPI.   Objective:   Physical Exam: BP 125/78   Pulse 79   Temp 98.2 F (36.8 C) (Temporal)   Resp 18   Ht 5' 6.34" (1.685 m)   Wt 246 lb 9.6 oz (111.9 kg)   SpO2 97%   BMI 39.40 kg/m  Body mass index is 39.4 kg/m. GEN: alert, well developed HEENT: clear conjunctiva,  nose with + mild inferior turbinate hypertrophy, pink nasal mucosa, slight clear rhinorrhea, + cobblestoning HEART: regular rate and rhythm, no murmur LUNGS: clear to auscultation bilaterally, no coughing, unlabored respiration ABDOMEN: soft, non distended  SKIN: no rashes or lesions  Assessment:   1. Other allergic rhinitis   2. Gastroesophageal reflux disease, unspecified whether esophagitis present   3. Other allergic and dietetic gastroenteritis and colitis     Plan/Recommendations:  Other Allergic Rhinitis: - Due to turbinate hypertrophy, seasonal symptoms and unresponsive to over the counter meds, will perform skin testing to identify aeroallergen triggers.   - Use nasal saline rinses before nose sprays such as with Neilmed Sinus Rinse.  Use distilled water.   - Use Flonase 2 sprays each nostril daily. Aim upward and outward. - Use Claritin 10 mg daily.  - Can consider trial of doxycycline.  If testing is negative, will refer to ENT.   GERD -Continue Protonix 40mg  daily on empty stomach.  -Avoid lying down for at least two hours after a meal or after drinking acidic beverages, like soda, or other caffeinated beverages. This can help  to prevent stomach contents from flowing back into the esophagus. -Keep your head elevated while you sleep. Using an extra pillow or two can also help to prevent reflux. -Eat smaller and more frequent meals each day instead of a few large meals. This promotes digestion and can aid in preventing heartburn. -Wear loose-fitting clothes to ease pressure on the stomach, which can worsen heartburn and reflux. -Reduce excess weight around the midsection. This can ease pressure on the stomach. Such pressure can force some stomach contents back up the esophagus.   Diarrhea - Concerning for IBS - Will do skin testing at next visit to foods. Discussed food sensitivity panel have no clinical evidence.  - Recommend GI evaluation.   Follow up: 11/24/2023 at 130 PM for skin testing 1-68, IDs okay    Alesia Morin, MD [facility]

## 2023-11-17 NOTE — Patient Instructions (Addendum)
Other Allergic Rhinitis: - Use nasal saline rinses before nose sprays such as with Neilmed Sinus Rinse bottle.  Use distilled water.   - Use Flonase 2 sprays each nostril daily. Aim upward and outward. - Use Claritin 10 mg daily.  - If testing is negative, will refer to ENT.  - Hold all anti-histamines (Xyzal, Allegra, Zyrtec, Claritin, Benadryl) 3 days prior to next visit.   GERD -Continue Protonix 40mg  daily on empty stomach.  -Avoid lying down for at least two hours after a meal or after drinking acidic beverages, like soda, or other caffeinated beverages. This can help to prevent stomach contents from flowing back into the esophagus. -Keep your head elevated while you sleep. Using an extra pillow or two can also help to prevent reflux. -Eat smaller and more frequent meals each day instead of a few large meals. This promotes digestion and can aid in preventing heartburn. -Wear loose-fitting clothes to ease pressure on the stomach, which can worsen heartburn and reflux. -Reduce excess weight around the midsection. This can ease pressure on the stomach. Such pressure can force some stomach contents back up the esophagus.   Diarrhea - Will do skin testing at next visit to foods   Follow up: 11/24/2023 at 130 PM for skin testing 1-68

## 2023-11-18 ENCOUNTER — Telehealth: Payer: Self-pay | Admitting: Orthopedic Surgery

## 2023-11-18 NOTE — Telephone Encounter (Signed)
 Dr. Areatha pt -  FYI, pt had an appointment scheduled for 1/06 for PREOP 2 WEEKS BEFORE SURGERY ON 12/09/23, he stated that he does not want to rs this appt yet and doesn't want to cancel his surgery.  He stated that he has a sinus infection that just will not go away and is working on that with his PCP and going for allergy  test.

## 2023-11-18 NOTE — Telephone Encounter (Signed)
Ok thanks 

## 2023-11-24 ENCOUNTER — Ambulatory Visit: Payer: Medicare Other | Admitting: Orthopedic Surgery

## 2023-11-24 ENCOUNTER — Telehealth: Payer: Self-pay | Admitting: Orthopedic Surgery

## 2023-11-24 ENCOUNTER — Ambulatory Visit (INDEPENDENT_AMBULATORY_CARE_PROVIDER_SITE_OTHER): Payer: Medicare Other | Admitting: Internal Medicine

## 2023-11-24 DIAGNOSIS — J3089 Other allergic rhinitis: Secondary | ICD-10-CM | POA: Diagnosis not present

## 2023-11-24 DIAGNOSIS — K5229 Other allergic and dietetic gastroenteritis and colitis: Secondary | ICD-10-CM | POA: Diagnosis not present

## 2023-11-24 DIAGNOSIS — J301 Allergic rhinitis due to pollen: Secondary | ICD-10-CM | POA: Diagnosis not present

## 2023-11-24 MED ORDER — CETIRIZINE HCL 10 MG PO TABS: 10.0000 mg | ORAL_TABLET | Freq: Every day | ORAL | 5 refills | Status: DC

## 2023-11-24 MED ORDER — AZELASTINE HCL 0.1 % NA SOLN: 2.0000 | Freq: Two times a day (BID) | NASAL | 5 refills | Status: DC | PRN

## 2023-11-24 NOTE — Patient Instructions (Addendum)
 Allergic Rhinitis: - SPT 11/2023: positive to trees, grasses, molds  - Use nasal saline rinses before nose sprays such as with Neilmed Sinus Rinse bottle.  Use distilled water .   - Use Flonase  2 sprays each nostril daily. Aim upward and outward. - Use Azelastine  1-2 sprays each nostril twice daily as needed for runny nose, drainage, sneezing, congestion. Aim upward and outward. - Use Zyrtec  10mg  daily. This replaces Claritin.   GERD -Continue Protonix  40mg  daily on empty stomach.  -Avoid lying down for at least two hours after a meal or after drinking acidic beverages, like soda, or other caffeinated beverages. This can help to prevent stomach contents from flowing back into the esophagus. -Keep your head elevated while you sleep. Using an extra pillow or two can also help to prevent reflux. -Eat smaller and more frequent meals each day instead of a few large meals. This promotes digestion and can aid in preventing heartburn. -Wear loose-fitting clothes to ease pressure on the stomach, which can worsen heartburn and reflux. -Reduce excess weight around the midsection. This can ease pressure on the stomach. Such pressure can force some stomach contents back up the esophagus.   Diarrhea - SPT 11/2023: negative to commonly allergenic foods   ALLERGEN AVOIDANCE MEASURES    Molds - Indoor avoidance Use air conditioning to reduce indoor humidity.  Do not use a humidifier. Keep indoor humidity at 30 - 40%.  Use a dehumidifier if needed. In the bathroom use an exhaust fan or open a window after showering.  Wipe down damp surfaces after showering.  Clean bathrooms with a mold-killing solution (diluted bleach, or products like Tilex, etc) at least once a month. In the kitchen use an exhaust fan to remove steam from cooking.  Throw away spoiled foods immediately, and empty garbage daily.  Empty water  pans below self-defrosting refrigerators frequently. Vent the clothes dryer to the outside. Limit  indoor houseplants; mold grows in the dirt.  No houseplants in the bedroom. Remove carpet from the bedroom. Encase the mattress and box springs with a zippered encasing.  Molds - Outdoor avoidance Avoid playing in leaves, pine straw, hay, etc.  Dead plant materials contain mold. Avoid going into barns or grain storage areas. Remove leaves, clippings and compost from around the home.  Pollen Avoidance Pollen levels are highest during the mid-day and afternoon.  Consider this when planning outdoor activities. Avoid being outside when the grass is being mowed, or wear a mask if the pollen-allergic person must be the one to mow the grass. Keep the windows closed to keep pollen outside of the home. Use an air conditioner to filter the air. Take a shower, wash hair, and change clothing after working or playing outdoors during pollen season.

## 2023-11-24 NOTE — Telephone Encounter (Signed)
 Bishop, Monroe Qin N, RMA  Severn Goddard W, RT Pt called and states he would like to postpone his surgery Sent message to Eber Jones  Told patient to let me know when he is ready to [facility]

## 2023-11-24 NOTE — Telephone Encounter (Signed)
 Bishop, Jashawna Reever N, RMA  Ramell Wacha W, RT Pt called and states he would like to postpone his surgery  He wants to wait another year, meloxicam is helping its not much pain now He wants to know if he you can recommend anything to get him through the year.

## 2023-11-24 NOTE — Progress Notes (Signed)
 FOLLOW UP Date of Service/Encounter:  11/24/23   Subjective:  Peter Mora (DOB: Feb 11, 1946) is a 78 y.o. male who [facility] for follow up for skin testing.   History obtained from: chart review and patient.  Anti histamines held.   Past Medical History: Past Medical History:  Diagnosis Date   Arthritis    in both knees   Sleep apnea     Objective:  There were no vitals taken for this visit. There is no height or weight on file to calculate BMI. Physical Exam: GEN: alert, well developed HEENT: clear conjunctiva, MMM LUNGS: unlabored respiration  Skin Testing:  Skin prick testing was placed, which includes aeroallergens/foods, histamine control, and saline control.  Verbal consent was obtained prior to placing test.  Patient tolerated procedure well.  Allergy  testing results were read and interpreted by myself, documented by clinical staff. Adequate positive and negative control.  Positive results to:  Results discussed with patient/family.  Airborne Adult Perc - 11/24/23 1347     Time Antigen Placed 1347    Allergen Manufacturer Jestine    Location Back    Number of Test 55    1. Control-Buffer 50% Glycerol Negative    2. Control-Histamine 3+    3. Bahia 2+    4. Bermuda Negative    5. Johnson Negative    6. [place]  Blue 3+    7. Meadow Fescue 3+    8. Perennial Rye Negative    9. Timothy Negative    10. Ragweed Mix Negative    11. Cocklebur Negative    12. Plantain,  English Negative    13. Baccharis Negative    14. Dog Fennel Negative    15. Russian Thistle Negative    16. Lamb's Quarters Negative    17. Sheep Sorrell Negative    18. Rough Pigweed Negative    19. Marsh Elder, Rough Negative    20. Mugwort, Common Negative    21. Box, Elder 3+    22. Cedar, red Negative    23. Sweet Gum 2+    24. Pecan Pollen 3+    25. Pine Mix Negative    26. Walnut, Black Pollen 3+    27. Red Mulberry Negative     28. Ash Mix 3+    29. Birch Mix 3+    30. Beech American 3+    31. Cottonwood, Eastern 3+    32. Hickory, White Negative    33. Maple Mix Negative    34. Oak, Eastern Mix 3+    35. Sycamore Eastern Negative    36. Alternaria Alternata 3+    37. Cladosporium Herbarum Negative    38. Aspergillus Mix Negative    39. Penicillium Mix Negative    40. Bipolaris Sorokiniana (Helminthosporium) 3+    41. Drechslera Spicifera (Curvularia) Negative    42. Mucor Plumbeus Negative    43. Fusarium Moniliforme Negative    44. Aureobasidium Pullulans (pullulara) Negative    45. Rhizopus Oryzae Negative    46. Botrytis Cinera Negative    47. Epicoccum Nigrum Negative    48. Phoma Betae Negative    49. Dust Mite Mix Negative    50. Cat Hair 10,000 BAU/ml Negative    51.  Dog Epithelia Negative    52. Mixed Feathers Negative    53. Horse Epithelia Negative    54. Cockroach, German Negative    55. Tobacco Leaf Negative  13 Food Perc - 11/24/23 1347       Test Information   Time Antigen Placed 1347    Allergen Manufacturer Jestine    Location Back    Number of allergen test 55      Food   1. Peanut Negative    2. Soybean Negative    3. Wheat Negative    4. Sesame Negative    5. Milk, Cow Negative    6. Casein Negative    7. Egg White, Chicken Negative    8. Shellfish Mix Negative    9. Fish Mix Negative    10. Cashew Negative    11. Walnut Food Negative    12. Almond Negative    13. Hazelnut Negative              Assessment:   1. Seasonal allergic rhinitis due to pollen   2. Allergic rhinitis caused by mold     Plan/Recommendations:  Allergic Rhinitis: - Due to turbinate hypertrophy and unresponsive to over the counter meds, will perform skin testing to identify aeroallergen triggers.  - Discussed I believe he has chronic allergies, not chronic sinus infection as his symptoms have been ongoing for almost 1.5-2 years ago and not having high fevers,  significant sinus pain, response to antibiotics.   - SPT 11/2023: positive to trees, grasses, molds  - Use nasal saline rinses before nose sprays such as with Neilmed Sinus Rinse bottle.  Use distilled water .   - Use Flonase  2 sprays each nostril daily. Aim upward and outward. - Use Azelastine  1-2 sprays each nostril twice daily as needed for runny nose, drainage, sneezing, congestion. Aim upward and outward. - Use Zyrtec  10mg  daily instead of Claritin.  - Consider allergy  shots as long term control of your symptoms by teaching your immune system to be more tolerant of your allergy  triggers   GERD -Continue Protonix  40mg  daily on empty stomach.  -Avoid lying down for at least two hours after a meal or after drinking acidic beverages, like soda, or other caffeinated beverages. This can help to prevent stomach contents from flowing back into the esophagus. -Keep your head elevated while you sleep. Using an extra pillow or two can also help to prevent reflux. -Eat smaller and more frequent meals each day instead of a few large meals. This promotes digestion and can aid in preventing heartburn. -Wear loose-fitting clothes to ease pressure on the stomach, which can worsen heartburn and reflux. -Reduce excess weight around the midsection. This can ease pressure on the stomach. Such pressure can force some stomach contents back up the esophagus.   Diarrhea - SPT 11/2023: negative to commonly allergenic foods.  Discussed this is not an IgE mediated food allergy  as skin testing has high sensitivity.   - Consider GI follow up.   Return in about 2 months (around 01/22/2024).  Arleta Blanch, MD [facility] 

## 2023-11-25 ENCOUNTER — Other Ambulatory Visit: Payer: Self-pay | Admitting: Orthopedic Surgery

## 2023-12-09 ENCOUNTER — Ambulatory Visit (HOSPITAL_COMMUNITY): Admit: 2023-12-09 | Payer: Medicare Other | Admitting: Orthopedic Surgery

## 2023-12-09 SURGERY — TOTAL KNEE ARTHROPLASTY
Anesthesia: Choice | Site: Knee | Laterality: Left

## 2023-12-11 ENCOUNTER — Ambulatory Visit (INDEPENDENT_AMBULATORY_CARE_PROVIDER_SITE_OTHER): Payer: PPO | Admitting: Internal Medicine

## 2023-12-11 ENCOUNTER — Encounter: Payer: Self-pay | Admitting: Internal Medicine

## 2023-12-11 VITALS — BP 136/84 | HR 87 | Ht 66.0 in | Wt 248.2 lb

## 2023-12-11 DIAGNOSIS — M1712 Unilateral primary osteoarthritis, left knee: Secondary | ICD-10-CM

## 2023-12-11 DIAGNOSIS — N529 Male erectile dysfunction, unspecified: Secondary | ICD-10-CM | POA: Diagnosis not present

## 2023-12-11 DIAGNOSIS — J321 Chronic frontal sinusitis: Secondary | ICD-10-CM | POA: Diagnosis not present

## 2023-12-11 DIAGNOSIS — I10 Essential (primary) hypertension: Secondary | ICD-10-CM | POA: Diagnosis not present

## 2023-12-11 DIAGNOSIS — K219 Gastro-esophageal reflux disease without esophagitis: Secondary | ICD-10-CM | POA: Diagnosis not present

## 2023-12-11 MED ORDER — SILDENAFIL CITRATE 100 MG PO TABS: 100.0000 mg | ORAL_TABLET | Freq: Every day | ORAL | 2 refills | Status: AC | PRN

## 2023-12-11 MED ORDER — PANTOPRAZOLE SODIUM 40 MG PO TBEC: 40.0000 mg | DELAYED_RELEASE_TABLET | Freq: Every day | ORAL | 3 refills | Status: DC

## 2023-12-11 NOTE — Progress Notes (Signed)
New Patient Office Visit  Subjective:  Patient ID: Peter Mora, male    DOB: 1946/02/17  Age: 78 y.o. MRN: [id number]  CC:  Chief Complaint  Patient presents with   Blood Pressure Check    Two month follow up   Allergic Rhinitis     HPI Peter Mora is a 78 y.o. male with past medical history of GERD, IBS and obesity who presents for f/u of his chronic medical conditions.  GERD/gastritis and IBS: He takes pantoprazole 40 mg QD for it.  Denies any nausea or vomiting currently.  He reports improvement in bowel frequency since decreasing alcohol intake.  He reports about 3-4 BM daily in the morning, which are not loose, but has stool urgency.  Has been followed by GI.  He has been told of lactose intolerance and tries to avoid lactose containing food.  Chronic sinusitis: He has chronic nasal congestion, and has had Augmentin recently.  He has dry cough, postnasal drip and throat irritation, but denies any fever, chills, dyspnea or wheezing currently.  He was evaluated by allergy clinic, had allergy testing, which showed allergies to multiple plants, dust, pollen, etc.  He still uses azelastine nasal spray and has recently started Zyrtec, but states that he still has chronic nasal congestion.  He works with wood, and tries to avoid direct wood dust exposure by wearing mask.  He agrees to continue Flonase, azelastine and Zyrtec for now.  OA of knee and elbow: Followed by orthopedic surgery.  He gets steroid injection in the elbow for it.  He currently takes meloxicam 15 mg once daily.  Alcohol dependence: He used to take about 3-5 drinks of whiskey and 2 beers almost daily, but has cut down alcohol use significantly now.  He agrees to still cut down alcoholic drinks and continue multivitamin supplement for now.    Past Medical History:  Diagnosis Date   Arthritis    in both knees   Sleep apnea     Past Surgical History:  Procedure Laterality Date   BACK SURGERY      excision of melanoma   BIOPSY  04/07/2018   Procedure: BIOPSY;  Surgeon: West Bali, MD;  [facility]: [facility];  Service: Endoscopy;;  random colon biopsy   BIOPSY  11/23/2019   Procedure: BIOPSY;  Surgeon: West Bali, MD;  [facility]: [facility];  Service: Endoscopy;;  duodenal bulb, second portion of duodenal , duodenal nodule    COLONOSCOPY WITH PROPOFOL N/A 04/07/2018   one 4 mm hyperplastic rectal polyp, redundant left colon, external and internal hemorrhoids   ESOPHAGOGASTRODUODENOSCOPY (EGD) WITH PROPOFOL N/A 04/07/2018   Negative Barrett's. medium sized hiatal hernia. moderate gastritis/duodenitis   ESOPHAGOGASTRODUODENOSCOPY (EGD) WITH PROPOFOL N/A 11/23/2019   Procedure: ESOPHAGOGASTRODUODENOSCOPY (EGD) WITH PROPOFOL;  Surgeon: West Bali, MD;  [facility]: [facility];  Service: Endoscopy;  Laterality: N/A;  11:15am   KNEE ARTHROSCOPY WITH MEDIAL MENISECTOMY Left 10/02/2017   Procedure: LEFT KNEE ARTHROSCOPY WITH PARTIAL MEDIAL MENISECTOMY;  Surgeon: Vickki Hearing, MD;  Location: [facility];  Service: Orthopedics;  Laterality: Left;   POLYPECTOMY  04/07/2018   Procedure: POLYPECTOMY;  Surgeon: West Bali, MD;  [facility]: [facility];  Service: Endoscopy;;  rectal polyp cs    Family History  Problem Relation Age of Onset   Cancer Mother        esophageal   Cancer Sister        colon and breast  Cancer Brother        colon   Cancer Father 42       agressive lung cancer    Social History   Socioeconomic History   Marital status: Single    Spouse name: Peter "shelly"   Number of children: 3   Years of education: 16   Highest education level: Bachelor's degree (e.g., BA, AB, BS)  Occupational History   Occupation: woodworker    Comment: retired   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  Tobacco Use   Smoking status: Former    Current packs/day: 0.00    Types: Cigarettes    Start date: 09/16/1967    Quit date: 09/15/2002    Years since quitting: 21.2    Smokeless tobacco: Never  Vaping Use   Vaping status: Never Used  Substance and Sexual Activity   Alcohol use: Yes    Alcohol/week: 42.0 standard drinks of alcohol    Types: 21 Cans of beer, 21 Shots of liquor per week    Comment: 6 drinks per day   Drug use: No   Sexual activity: Not Currently    Birth control/protection: None  Other Topics Concern   Not on file  Social History Narrative   Lives with second wife Peter Mora, [place] woman   She travels a lot   Retired Printmaker   No reg exercise   Social Drivers of Corporate investment banker Strain: Not on file  Food Insecurity: Not on file  Transportation Needs: Not on file  Physical Activity: Not on file  Stress: Not on file  Social Connections: Not on file  Intimate Partner Violence: Not on file    ROS Review of Systems  Constitutional:  Negative for chills and fever.  HENT:  Negative for congestion and sore throat.   Eyes:  Negative for pain and discharge.  Respiratory:  Negative for cough and shortness of breath.   Cardiovascular:  Negative for chest pain and palpitations.  Gastrointestinal:  Negative for diarrhea, nausea and vomiting.  Endocrine: Negative for polydipsia and polyuria.  Genitourinary:  Negative for dysuria and hematuria.  Musculoskeletal:  Positive for arthralgias (L elbow and left knee). Negative for neck pain and neck stiffness.  Skin:  Negative for rash.  Neurological:  Negative for dizziness, weakness, numbness and headaches.  Psychiatric/Behavioral:  Negative for agitation and behavioral problems.     Objective:   Today's Vitals: BP 136/84 (BP Location: Left Arm)   Pulse 87   Ht 5\' 6"  (1.676 m)   Wt 248 lb 3.2 oz (112.6 kg)   SpO2 95%   BMI 40.06 kg/m   Physical Exam Vitals reviewed.  Constitutional:      General: He is not in acute distress.    Appearance: He is not diaphoretic.  HENT:     Head: Normocephalic and atraumatic.     Nose: Nose normal.     Mouth/Throat:      Mouth: Mucous membranes are moist.  Eyes:     General: No scleral icterus.    Extraocular Movements: Extraocular movements intact.  Cardiovascular:     Rate and Rhythm: Normal rate and regular rhythm.     Heart sounds: Normal heart sounds. No murmur heard. Pulmonary:     Breath sounds: Normal breath sounds. No wheezing or rales.  Musculoskeletal:     Cervical back: Neck supple. No tenderness.     Right lower leg: No edema.     Left lower leg: No edema.  Skin:  General: Skin is warm.     Findings: No rash.  Neurological:     General: No focal deficit present.     Mental Status: He is alert and oriented to person, place, and time.  Psychiatric:        Mood and Affect: Mood normal.        Behavior: Behavior normal.     Assessment & Plan:   Problem List Items Addressed This Visit       Cardiovascular and Mediastinum   Elevated blood pressure reading with diagnosis of hypertension   BP Readings from Last 1 Encounters:  12/11/23 136/84   Needs to cut down alcohol use Home BP readings reviewed - mostly around 120s/70s Advised DASH diet and moderate exercise/walking, at least 150 mins/week       Relevant Medications   sildenafil (VIAGRA) 100 MG tablet     Respiratory   Chronic frontal sinusitis - Primary   Continue Flonase for allergies Referred to allergy clinic for allergy testing - added azelastine and Zyrtec, needs to continue        Digestive   GERD (gastroesophageal reflux disease)   Overall well controlled with pantoprazole 40 mg QD Advised to reduce NSAID use      Relevant Medications   pantoprazole (PROTONIX) 40 MG tablet     Musculoskeletal and Integument   Primary osteoarthritis of left knee   Followed by Orthopedic surgery On Meloxicam 15 mg once daily, advised to alternate with Tylenol arthritis        Other   Erectile dysfunction   Has difficulty maintaining erection, has tried Cialis 20 mg without much relief Switched to sildenafil  100 mg as needed Needs to cut down alcohol intake      Relevant Medications   sildenafil (VIAGRA) 100 MG tablet    Outpatient Encounter Medications as of 12/11/2023  Medication Sig   sildenafil (VIAGRA) 100 MG tablet Take 1 tablet (100 mg total) by mouth daily as needed for erectile dysfunction.   azelastine (ASTELIN) 0.1 % nasal spray Place 2 sprays into both nostrils 2 (two) times daily as needed. Use in each nostril as directed   CALCIUM-MAGNESIUM-ZINC PO Take by mouth daily.   cetirizine (ZYRTEC ALLERGY) 10 MG tablet Take 1 tablet (10 mg total) by mouth daily.   cholecalciferol (VITAMIN D3) 25 MCG (1000 UT) tablet Take 1,000 Units by mouth daily.   fluticasone (FLONASE) 50 MCG/ACT nasal spray Place 2 sprays into both nostrils daily.   meloxicam (MOBIC) 15 MG tablet Take 1 tablet by mouth once daily   Multiple Vitamin (MULTIVITAMIN WITH MINERALS) TABS tablet Take 1 tablet by mouth daily.   pantoprazole (PROTONIX) 40 MG tablet Take 1 tablet (40 mg total) by mouth daily.   Potassium (POTASSIMIN PO) Take by mouth daily.   vitamin B-12 (CYANOCOBALAMIN) 1000 MCG tablet Take 1,000 mcg by mouth daily.   [DISCONTINUED] amoxicillin-clavulanate (AUGMENTIN) 875-125 MG tablet Take 1 tablet by mouth 2 (two) times daily. (Patient not taking: Reported on 11/17/2023)   [DISCONTINUED] pantoprazole (PROTONIX) 40 MG tablet TAKE 1 TABLET BY MOUTH ONCE DAILY 30 MINUTES  BEFORE BREAKFAST   Facility-Administered Encounter Medications as of 12/11/2023  Medication   bupivacaine-meloxicam ER (ZYNRELEF) injection 400 mg    Follow-up: Return in about 6 months (around 06/09/2024) for GERD.   Anabel Halon, MD

## 2023-12-11 NOTE — Assessment & Plan Note (Signed)
 Followed by Orthopedic surgery On Meloxicam 15 mg once daily, advised to alternate with Tylenol arthritis

## 2023-12-11 NOTE — Patient Instructions (Signed)
Please use Flonase once regularly and use Astelin up to twice daily.  Please continue to take medications as prescribed.  Please continue to follow DASH diet and perform moderate exercise/walking at least 150 mins/week.

## 2023-12-11 NOTE — Assessment & Plan Note (Signed)
 Overall well controlled with pantoprazole 40 mg QD Advised to reduce NSAID use

## 2023-12-11 NOTE — Assessment & Plan Note (Addendum)
Has difficulty maintaining erection, has tried Cialis 20 mg without much relief Switched to sildenafil 100 mg as needed Needs to cut down alcohol intake

## 2023-12-11 NOTE — Assessment & Plan Note (Addendum)
Continue Flonase for allergies Referred to allergy clinic for allergy testing - added azelastine and Zyrtec, needs to continue

## 2023-12-11 NOTE — Assessment & Plan Note (Addendum)
BP Readings from Last 1 Encounters:  12/11/23 136/84   Needs to cut down alcohol use Home BP readings reviewed - mostly around 120s/70s Advised DASH diet and moderate exercise/walking, at least 150 mins/week

## 2024-02-23 ENCOUNTER — Encounter: Payer: Self-pay | Admitting: Internal Medicine

## 2024-02-23 ENCOUNTER — Ambulatory Visit: Payer: Medicare Other | Admitting: Internal Medicine

## 2024-02-23 ENCOUNTER — Other Ambulatory Visit: Payer: Self-pay

## 2024-02-23 VITALS — BP 130/72 | HR 90 | Temp 98.2°F | Resp 14 | Wt 248.0 lb

## 2024-02-23 DIAGNOSIS — J3089 Other allergic rhinitis: Secondary | ICD-10-CM | POA: Diagnosis not present

## 2024-02-23 DIAGNOSIS — K219 Gastro-esophageal reflux disease without esophagitis: Secondary | ICD-10-CM | POA: Diagnosis not present

## 2024-02-23 DIAGNOSIS — J302 Other seasonal allergic rhinitis: Secondary | ICD-10-CM | POA: Diagnosis not present

## 2024-02-23 MED ORDER — CETIRIZINE HCL 10 MG PO TABS: 10.0000 mg | ORAL_TABLET | Freq: Every day | ORAL | 3 refills | Status: AC

## 2024-02-23 MED ORDER — FLUTICASONE PROPIONATE 50 MCG/ACT NA SUSP: 2.0000 | Freq: Every day | NASAL | 11 refills | Status: DC

## 2024-02-23 MED ORDER — AZELASTINE HCL 0.1 % NA SOLN: 2.0000 | Freq: Two times a day (BID) | NASAL | 11 refills | Status: AC | PRN

## 2024-02-23 NOTE — Progress Notes (Signed)
   FOLLOW UP Date of Service/Encounter:  02/23/24   Subjective:  Peter Mora (DOB: December 03, 1945) is a 78 y.o. male who [facility] for follow up for allergic rhinitis and GERD.  History obtained from: chart review and patient. Last visit was with me where we did aeroallergen testing and discussed he has chronic rhinitis/sinusitis, not acute and does not need further antibiotics but rather better long term control. Started on Flonase/Zyrtec and PRN Azelastine, also on Protonix.  Reports doing better since last visit.  Less mucous drainage, congestion but still with some post nasal drip.  He is overall happy with progress.  Has not been using nose sprays much, does not like taking steroids.   Reflux is doing well on Protonix.   He would like to do PRN follow up.    Past Medical History: Past Medical History:  Diagnosis Date   Arthritis    in both knees   Sleep apnea     Objective:  BP 130/72   Pulse 90   Temp 98.2 F (36.8 C)   Resp 14   Wt 248 lb (112.5 kg)   SpO2 95%   BMI 40.03 kg/m  Body mass index is 40.03 kg/m. Physical Exam: GEN: alert, well developed HEENT: clear conjunctiva, nose with mild inferior turbinate hypertrophy, pink nasal mucosa, clear rhinorrhea, + cobblestoning HEART: regular rate and rhythm, no murmur LUNGS: clear to auscultation bilaterally, no coughing, unlabored respiration SKIN: no rashes or lesions  Assessment:   1. Seasonal and perennial allergic rhinitis   2. Gastroesophageal reflux disease, unspecified whether esophagitis present     Plan/Recommendations:  Allergic Rhinitis: - Improved on medical management  - SPT 11/2023: positive to trees, grasses, molds  - Use nasal saline rinses before nose sprays such as with Neilmed Sinus Rinse bottle.  Use distilled water.   - Use Flonase 2 sprays each nostril daily. Aim upward and outward. - Use Azelastine 1-2 sprays each nostril twice daily as  needed for runny nose, drainage, sneezing, congestion. Aim upward and outward. - Use Zyrtec 10mg  daily.   GERD - Controlled  -Continue Protonix 40mg  daily on empty stomach.  -Avoid lying down for at least two hours after a meal or after drinking acidic beverages, like soda, or other caffeinated beverages. This can help to prevent stomach contents from flowing back into the esophagus. -Keep your head elevated while you sleep. Using an extra pillow or two can also help to prevent reflux. -Eat smaller and more frequent meals each day instead of a few large meals. This promotes digestion and can aid in preventing heartburn. -Wear loose-fitting clothes to ease pressure on the stomach, which can worsen heartburn and reflux. -Reduce excess weight around the midsection. This can ease pressure on the stomach. Such pressure can force some stomach contents back up the esophagus.       Return if symptoms worsen or fail to improve.  Alesia Morin, MD [facility]

## 2024-02-23 NOTE — Patient Instructions (Addendum)
 Allergic Rhinitis: - SPT 11/2023: positive to trees, grasses, molds  - Use nasal saline rinses before nose sprays such as with Neilmed Sinus Rinse bottle.  Use distilled water.   - If symptoms worse, use Flonase 2 sprays each nostril daily. Aim upward and outward. - Use Azelastine 1-2 sprays each nostril twice daily as needed for runny nose, drainage, sneezing, congestion. Aim upward and outward. - Use Zyrtec 10mg  daily.   GERD -Continue Protonix 40mg  daily on empty stomach.  -Avoid lying down for at least two hours after a meal or after drinking acidic beverages, like soda, or other caffeinated beverages. This can help to prevent stomach contents from flowing back into the esophagus. -Keep your head elevated while you sleep. Using an extra pillow or two can also help to prevent reflux. -Eat smaller and more frequent meals each day instead of a few large meals. This promotes digestion and can aid in preventing heartburn. -Wear loose-fitting clothes to ease pressure on the stomach, which can worsen heartburn and reflux. -Reduce excess weight around the midsection. This can ease pressure on the stomach. Such pressure can force some stomach contents back up the esophagus.

## 2024-03-01 ENCOUNTER — Ambulatory Visit: Payer: Medicare Other | Admitting: Internal Medicine

## 2024-03-02 ENCOUNTER — Other Ambulatory Visit: Payer: Self-pay | Admitting: Orthopedic Surgery

## 2024-04-01 ENCOUNTER — Ambulatory Visit: Admitting: Orthopedic Surgery

## 2024-04-01 ENCOUNTER — Encounter: Payer: Self-pay | Admitting: Orthopedic Surgery

## 2024-04-01 ENCOUNTER — Other Ambulatory Visit (INDEPENDENT_AMBULATORY_CARE_PROVIDER_SITE_OTHER): Payer: Self-pay

## 2024-04-01 VITALS — BP 130/72 | Ht 66.0 in | Wt 248.0 lb

## 2024-04-01 DIAGNOSIS — M79642 Pain in left hand: Secondary | ICD-10-CM | POA: Diagnosis not present

## 2024-04-01 NOTE — Progress Notes (Signed)
  Intake history:  BP 130/72 Comment: 02/22/24  Ht 5\' 6"  (1.676 m)   Wt 248 lb (112.5 kg)   BMI 40.03 kg/m  Body mass index is 40.03 kg/m.    WHAT ARE WE SEEING YOU FOR TODAY?   left hand(s)  How long has this bothered you? (DOI?DOS?WS?)  7 month(s) ago since injury on riding leaf blower 7 months ago   Anticoag.  No  Diabetes No  Heart disease No  Hypertension Yes  SMOKING HX No  Kidney disease No  Any ALLERGIES ___________No Known Allergies ___________________________________   Treatment:  Have you taken:  Tylenol  Yes  Advil No  Had PT No  Had injection No  Other  ____________Meloxicam _____________

## 2024-04-01 NOTE — Progress Notes (Signed)
 Chief Complaint  Patient presents with   Hand Pain    Left/ since injury on riding leaf blower 7 months ago     78 year old male fell off of a standing lawnmower last October complains of pain left hand index finger and long finger  Pain is at the PIP joint of the right long finger and the base of the second metacarpal  Clinical exam left hand shows swelling of the PIP joint slight decrease in flexion and active extension passively I can range both to full range of motion he is tender at the base of the second metacarpal at the joint  DG Hand Complete Left Result Date: 04/01/2024 Pain left hand base of second metacarpal and IP joint right long finger Status post fall back in October complains of pain swelling decreased range of motion long finger right hand and base of second metacarpal X-ray showed mild narrowing of the metacarpal joint of the second metacarpal no fracture or dislocation seen in either area Impression mild OA CMC joint #2    Assessment and plan  Encounter Diagnosis  Name Primary?   Pain in left hand Yes    No obvious injury that requires any treatment he does have some PIP joint swelling which is not uncommon in PIP joint injuries the joint was not unstable in any manner just some decreased range of motion from the swelling.  He is reassured that this is normal and she should continue with range of motion exercises  OA at the base of the second metacarpal carpal joint can be managed with his meloxicam   We also talked about the meloxicam .  His primary care doctor wants to wean him off of the meloxicam .  However, if he weans off his meloxicam  goes to Tylenol  his arthritis pain may come back.  He has had normal LFTs and kidney function tests and I do not see any reason to take him off the medication he can take the least amount that is effective but to completely remove it will probably lead to joint pain.  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  Follow-up as needed

## 2024-04-29 ENCOUNTER — Encounter: Payer: Self-pay | Admitting: Internal Medicine

## 2024-04-29 ENCOUNTER — Other Ambulatory Visit: Payer: Self-pay | Admitting: Internal Medicine

## 2024-04-29 DIAGNOSIS — W57XXXA Bitten or stung by nonvenomous insect and other nonvenomous arthropods, initial encounter: Secondary | ICD-10-CM

## 2024-04-29 MED ORDER — DOXYCYCLINE HYCLATE 100 MG PO TABS: 100.0000 mg | ORAL_TABLET | Freq: Two times a day (BID) | ORAL | 0 refills | Status: DC

## 2024-05-06 ENCOUNTER — Encounter: Payer: Self-pay | Admitting: Internal Medicine

## 2024-05-06 ENCOUNTER — Ambulatory Visit: Payer: Self-pay | Admitting: Internal Medicine

## 2024-05-06 VITALS — BP 132/84 | HR 95 | Ht 66.0 in | Wt 251.8 lb

## 2024-05-06 DIAGNOSIS — F1020 Alcohol dependence, uncomplicated: Secondary | ICD-10-CM

## 2024-05-06 DIAGNOSIS — M1712 Unilateral primary osteoarthritis, left knee: Secondary | ICD-10-CM

## 2024-05-06 DIAGNOSIS — Z0000 Encounter for general adult medical examination without abnormal findings: Secondary | ICD-10-CM | POA: Diagnosis not present

## 2024-05-06 DIAGNOSIS — R9431 Abnormal electrocardiogram [ECG] [EKG]: Secondary | ICD-10-CM | POA: Diagnosis not present

## 2024-05-06 NOTE — Patient Instructions (Signed)
 Please continue to take medications as prescribed.  Please continue to follow low carb diet and perform moderate exercise/walking at least 150 mins/week.  Please consider getting Shingrix and Tdap vaccine at local pharmacy.

## 2024-05-06 NOTE — Assessment & Plan Note (Signed)
 Has cut down alcohol use to 4 drinks/day, used to take 3-5 drinks of whiskey and 2 beers daily Needs to cut down slowly on his drinks and limit maximum up to 2 drinks per day He believes he does not need any medicine or support group, which I agree -self-motivated. Continue multivitamin - B complex vitamins Checked CBC, CMP, TSH, vitamin  B1 and B12

## 2024-05-06 NOTE — Assessment & Plan Note (Signed)
 Followed by Orthopedic surgery On Meloxicam 15 mg once daily, advised to alternate with Tylenol arthritis

## 2024-05-06 NOTE — Progress Notes (Signed)
 Subjective:   Peter Mora is a 78 y.o. male who presents for an Initial Medicare Annual Wellness Visit.  Visit Complete: In person  Patient Medicare AWV questionnaire was completed by the patient on 05/06/2024; I have confirmed that all information answered by patient is correct and no changes since this date.  Cardiac Risk Factors include: none     Objective:    Today's Vitals   05/06/24 0839  BP: 132/84  Pulse: 95  SpO2: 96%  Weight: 251 lb 12.8 oz (114.2 kg)  Height: 5' 6 (1.676 m)  PainSc: 0-No pain   Body mass index is 40.64 kg/m.     05/06/2024    8:52 AM 11/23/2019   10:21 AM 04/07/2018   11:30 AM 04/02/2018   10:24 AM 10/02/2017    7:40 AM 10/01/2017    8:33 AM 09/15/2017    1:18 PM  Advanced Directives  Does Patient Have a Medical Advance Directive? No No No  No  No  No  No   Would patient like information on creating a medical advance directive? No - Patient declined No - Patient declined  No - Patient declined  No - Patient declined  No - Patient declined  No - Patient declined      Data saved with a previous flowsheet row definition    Current Medications (verified) Outpatient Encounter Medications as of 05/06/2024  Medication Sig   azelastine  (ASTELIN ) 0.1 % nasal spray Place 2 sprays into both nostrils 2 (two) times daily as needed. Use in each nostril as directed   CALCIUM-MAGNESIUM-ZINC PO Take by mouth daily.   cetirizine  (ZYRTEC  ALLERGY ) 10 MG tablet Take 1 tablet (10 mg total) by mouth daily.   cholecalciferol (VITAMIN D3) 25 MCG (1000 UT) tablet Take 1,000 Units by mouth daily.   doxycycline  (VIBRA -TABS) 100 MG tablet Take 1 tablet (100 mg total) by mouth 2 (two) times daily.   fluticasone  (FLONASE ) 50 MCG/ACT nasal spray Place 2 sprays into both nostrils daily.   meloxicam  (MOBIC ) 15 MG tablet Take 1 tablet by mouth once daily   Multiple Vitamin (MULTIVITAMIN WITH MINERALS) TABS tablet Take 1 tablet by mouth daily.   pantoprazole   (PROTONIX ) 40 MG tablet Take 1 tablet (40 mg total) by mouth daily.   Potassium (POTASSIMIN PO) Take by mouth daily.   sildenafil  (VIAGRA ) 100 MG tablet Take 1 tablet (100 mg total) by mouth daily as needed for erectile dysfunction.   vitamin B-12 (CYANOCOBALAMIN) 1000 MCG tablet Take 1,000 mcg by mouth daily.   No facility-administered encounter medications on file as of 05/06/2024.    Allergies (verified) Patient has no known allergies.   History: Past Medical History:  Diagnosis Date   Arthritis    in both knees   Sleep apnea    Past Surgical History:  Procedure Laterality Date   BACK SURGERY     excision of melanoma   BIOPSY  04/07/2018   Procedure: BIOPSY;  Surgeon: Alyce Jubilee, MD;  [facility];  Service: Endoscopy;;  random colon biopsy   BIOPSY  11/23/2019   Procedure: BIOPSY;  Surgeon: Alyce Jubilee, MD;  [facility];  Service: Endoscopy;;  duodenal bulb, second portion of duodenal , duodenal nodule    COLONOSCOPY WITH PROPOFOL  N/A 04/07/2018   one 4 mm hyperplastic rectal polyp, redundant left colon, external and internal hemorrhoids   ESOPHAGOGASTRODUODENOSCOPY (EGD) WITH PROPOFOL  N/A 04/07/2018   Negative Barrett's. medium sized hiatal hernia. moderate gastritis/duodenitis  ESOPHAGOGASTRODUODENOSCOPY (EGD) WITH PROPOFOL  N/A 11/23/2019   Procedure: ESOPHAGOGASTRODUODENOSCOPY (EGD) WITH PROPOFOL ;  Surgeon: Alyce Jubilee, MD;  [facility]: [facility];  Service: Endoscopy;  Laterality: N/A;  11:15am   KNEE ARTHROSCOPY WITH MEDIAL MENISECTOMY Left 10/02/2017   Procedure: LEFT KNEE ARTHROSCOPY WITH PARTIAL MEDIAL MENISECTOMY;  Surgeon: Darrin Emerald, MD;  Location: [facility];  Service: Orthopedics;  Laterality: Left;   POLYPECTOMY  04/07/2018   Procedure: POLYPECTOMY;  Surgeon: Alyce Jubilee, MD;  [facility];  Service: Endoscopy;;  rectal polyp cs   Family History  Problem Relation Age of Onset   Cancer Mother        esophageal    Cancer Sister        colon and breast    Cancer Brother        colon   Cancer Father 40       agressive lung cancer   Social History   Socioeconomic History   Marital status: Single    Spouse name: Jeraldine Molt   Number of children: 3   Years of education: 16   Highest education level: Bachelor's degree (e.g., BA, AB, BS)  Occupational History   Occupation: woodworker    Comment: retired   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  Tobacco Use   Smoking status: Former    Current packs/day: 0.00    Types: Cigarettes    Start date: 09/16/1967    Quit date: 09/15/2002    Years since quitting: 21.6   Smokeless tobacco: Never  Vaping Use   Vaping status: Never Used  Substance and Sexual Activity   Alcohol use: Yes    Alcohol/week: 42.0 standard drinks of alcohol    Types: 21 Cans of beer, 21 Shots of liquor per week    Comment: 6 drinks per day   Drug use: No   Sexual activity: Not Currently    Birth control/protection: None  Other Topics Concern   Not on file  Social History Narrative   Lives with second wife Miller Allis, [place] woman   She travels a lot   Retired Printmaker   No reg exercise   Social Drivers of Corporate investment banker Strain: Low Risk  (05/06/2024)   Overall Financial Resource Strain (CARDIA)    Difficulty of Paying Living Expenses: Not hard at all  Food Insecurity: No Food Insecurity (05/06/2024)   Hunger Vital Sign    Worried About Running Out of Food in the Last Year: Never true    Ran Out of Food in the Last Year: Never true  Transportation Needs: No Transportation Needs (05/06/2024)   PRAPARE - Administrator, Civil Service (Medical): No    Lack of Transportation (Non-Medical): No  Physical Activity: Inactive (05/06/2024)   Exercise Vital Sign    Days of Exercise per Week: 0 days    Minutes of Exercise per Session: 0 min  Stress: No Stress Concern Present (05/06/2024)   [employer] of Occupational Health -  Occupational Stress Questionnaire    Feeling of Stress: Not at all  Social Connections: Socially Isolated (05/06/2024)   Social Connection and Isolation Panel    Frequency of Communication with Friends and Family: More than three times a week    Frequency of Social Gatherings with Friends and Family: More than three times a week    Attends Religious Services: Never    Database administrator or Organizations: No    Attends Banker Meetings: Never  Marital Status: Divorced    Tobacco Counseling Counseling given: Yes   Clinical Intake:  Pre-visit preparation completed: Yes  Pain : No/denies pain Pain Score: 0-No pain     BMI - recorded: 40.66 Nutritional Status: BMI > 30  Obese Nutritional Risks: None Diabetes: No  How often do you need to have someone help you when you read instructions, pamphlets, or other written materials from your doctor or pharmacy?: 1 - Never What is the last grade level you completed in school?: high school and bachelor degree  Interpreter Needed?: No      Activities of Daily Living    05/06/2024    8:44 AM  In your present state of health, do you have any difficulty performing the following activities:  Hearing? 0  Vision? 0  Difficulty concentrating or making decisions? 0  Walking or climbing stairs? 0  Dressing or bathing? 0  Doing errands, shopping? 0  Preparing Food and eating ? N  Using the Toilet? N  In the past six months, have you accidently leaked urine? N  Do you have problems with loss of bowel control? N  Managing your Medications? N  Managing your Finances? N  Housekeeping or managing your Housekeeping? N    Patient Care Team: Meldon Sport, MD as PCP - General (Internal Medicine) Flavia Hughs, MD (Inactive) as PCP - Cardiology (Cardiology) Alyce Jubilee, MD (Inactive) as Consulting Physician (Gastroenterology) Vinetta Greening, DO as Consulting Physician (Internal Medicine)  Indicate any  recent Medical Services you may have received from other than [facility] providers in the past year (date may be approximate).     Assessment:   This is a routine wellness examination for Mynor.  Hearing/Vision screen No results found.   Goals Addressed             This Visit's Progress    Weight (lb) < 200 lb (90.7 kg)   251 lb 12.8 oz (114.2 kg)    Pt reports trying to lose 50lbs , will try intermittent fasting.        Depression Screen    05/06/2024    8:49 AM 05/06/2024    8:36 AM 12/11/2023    1:28 PM 10/03/2023   10:32 AM 01/01/2018    8:56 AM  PHQ 2/9 Scores  PHQ - 2 Score 0 0 0 0 0  PHQ- 9 Score 0 0  1     Fall Risk    05/06/2024    8:36 AM 12/11/2023    1:28 PM 10/03/2023   10:32 AM 01/01/2018    8:56 AM  Fall Risk   Falls in the past year? 0 0 0 No   Number falls in past yr: 0 0 0   Injury with Fall? 0 0 0   Risk for fall due to : No Fall Risks No Fall Risks No Fall Risks   Follow up Falls evaluation completed Falls evaluation completed Falls evaluation completed      Data saved with a previous flowsheet row definition    MEDICARE RISK AT HOME:    TIMED UP AND GO:  Was the test performed? Yes  Length of time to ambulate 10 feet: 4 sec Gait steady and fast without use of assistive device    Cognitive Function:        05/06/2024    8:53 AM  6CIT Screen  What Year? 0 points  What month? 0 points  What time? 0 points  Count back from  20 0 points  Months in reverse 0 points  Repeat phrase 2 points  Total Score 2 points    Immunizations  There is no immunization history on file for this patient.  TDAP status: Due, Education has been provided regarding the importance of this vaccine. Advised may receive this vaccine at local pharmacy or Health Dept. Aware to provide a copy of the vaccination record if obtained from local pharmacy or Health Dept. Verbalized acceptance and understanding.  Flu Vaccine status: Declined, Education has been provided  regarding the importance of this vaccine but patient still declined. Advised may receive this vaccine at local pharmacy or Health Dept. Aware to provide a copy of the vaccination record if obtained from local pharmacy or Health Dept. Verbalized acceptance and understanding.  Pneumococcal vaccine status: Declined,  Education has been provided regarding the importance of this vaccine but patient still declined. Advised may receive this vaccine at local pharmacy or Health Dept. Aware to provide a copy of the vaccination record if obtained from local pharmacy or Health Dept. Verbalized acceptance and understanding.   Covid-19 vaccine status: Information provided on how to obtain vaccines.   Qualifies for Shingles Vaccine? Yes   Zostavax completed No   Shingrix Completed?: No.    Education has been provided regarding the importance of this vaccine. Patient has been advised to call insurance company to determine out of pocket expense if they have not yet received this vaccine. Advised may also receive vaccine at local pharmacy or Health Dept. Verbalized acceptance and understanding.  Screening Tests Health Maintenance  Topic Date Due   COVID-19 Vaccine (1) Never done   DTaP/Tdap/Td (1 - Tdap) Never done   Zoster Vaccines- Shingrix (1 of 2) Never done   Pneumococcal Vaccine: 50+ Years (1 of 1 - PCV) Never done   INFLUENZA VACCINE  06/18/2024   Medicare Annual Wellness (AWV)  05/06/2025   Hepatitis C Screening  Completed   HPV VACCINES  Aged Out   Meningococcal B Vaccine  Aged Out   Colonoscopy  Discontinued    Health Maintenance  Health Maintenance Due  Topic Date Due   COVID-19 Vaccine (1) Never done   DTaP/Tdap/Td (1 - Tdap) Never done   Zoster Vaccines- Shingrix (1 of 2) Never done   Pneumococcal Vaccine: 50+ Years (1 of 1 - PCV) Never done    Colorectal cancer screening: Type of screening: Colonoscopy. Completed 04/07/2018.  Lung Cancer Screening: (Low Dose CT Chest recommended if  Age 50-80 years, 20 pack-year currently smoking OR have quit w/in 15years.) does not qualify.     Additional Screening:  Hepatitis C Screening: does qualify; Completed 02/02/2018  Vision Screening: Recommended annual ophthalmology exams for early detection of glaucoma and other disorders of the eye. Is the patient up to date with their annual eye exam?  No  Who is the provider or what is the name of the office in which the patient attends annual eye exams?  If pt is not established with a provider, would they like to be referred to a provider to establish care? No .   Dental Screening: Recommended annual dental exams for proper oral hygiene   Community Resource Referral / Chronic Care Management: CRR required this visit?  No   CCM required this visit?  No    Plan:      Abnormal EKG EKG: Sinus rhythm. T wave inversions in precordial leads, similar compared to previous EKG in 2018. No signs of active ischemia.  Has had Echo  in the past, which showed mild LVH, but otherwise was benign.  Encounter for Medicare annual wellness exam AWV screening questionnaire reviewed. Advanced directive information material provided. Denies PCV20 vaccine. Advised to get Shingrix and Tdap at local pharmacy.  Primary osteoarthritis of left knee Followed by Orthopedic surgery On Meloxicam  15 mg once daily, advised to alternate with Tylenol  arthritis  Uncomplicated alcohol dependence (HCC) Has cut down alcohol use to 4 drinks/day, used to take 3-5 drinks of whiskey and 2 beers daily Needs to cut down slowly on his drinks and limit maximum up to 2 drinks per day He believes he does not need any medicine or support group, which I agree -self-motivated. Continue multivitamin - B complex vitamins Checked CBC, CMP, TSH, vitamin  B1 and B12   I have personally reviewed and noted the following in the patient's chart:   Medical and social history Use of alcohol, tobacco or illicit drugs  Current  medications and supplements including opioid prescriptions. Patient is not currently taking opioid prescriptions. Functional ability and status Nutritional status Physical activity Advanced directives List of other physicians Hospitalizations, surgeries, and ER visits in previous 12 months Vitals Screenings to include cognitive, depression, and falls Referrals and appointments  In addition, I have reviewed and discussed with patient certain preventive protocols, quality metrics, and best practice recommendations. A written personalized care plan for preventive services as well as general preventive health recommendations were provided to patient.     Meldon Sport, MD   05/06/2024   After Visit Summary: (In Person-Printed) AVS printed and given to the patient  Nurse Notes:

## 2024-05-06 NOTE — Assessment & Plan Note (Signed)
 AWV screening questionnaire reviewed. Advanced directive information material provided. Denies PCV20 vaccine. Advised to get Shingrix and Tdap at local pharmacy.

## 2024-05-06 NOTE — Assessment & Plan Note (Addendum)
 EKG: Sinus rhythm. T wave inversions in precordial leads, similar compared to previous EKG in 2018. No signs of active ischemia.  Has had Echo in the past, which showed mild LVH, but otherwise was benign.

## 2024-05-16 ENCOUNTER — Other Ambulatory Visit: Payer: Self-pay | Admitting: Orthopedic Surgery

## 2024-06-09 ENCOUNTER — Ambulatory Visit: Payer: PPO | Admitting: Internal Medicine

## 2024-08-10 ENCOUNTER — Other Ambulatory Visit: Payer: Self-pay | Admitting: Orthopedic Surgery

## 2024-09-01 DIAGNOSIS — Z08 Encounter for follow-up examination after completed treatment for malignant neoplasm: Secondary | ICD-10-CM | POA: Diagnosis not present

## 2024-09-01 DIAGNOSIS — C44319 Basal cell carcinoma of skin of other parts of face: Secondary | ICD-10-CM | POA: Diagnosis not present

## 2024-09-01 DIAGNOSIS — Z85820 Personal history of malignant melanoma of skin: Secondary | ICD-10-CM | POA: Diagnosis not present

## 2024-09-01 DIAGNOSIS — X32XXXA Exposure to sunlight, initial encounter: Secondary | ICD-10-CM | POA: Diagnosis not present

## 2024-09-01 DIAGNOSIS — L570 Actinic keratosis: Secondary | ICD-10-CM | POA: Diagnosis not present

## 2024-09-01 DIAGNOSIS — D225 Melanocytic nevi of trunk: Secondary | ICD-10-CM | POA: Diagnosis not present

## 2024-09-16 ENCOUNTER — Telehealth: Payer: Self-pay | Admitting: Orthopedic Surgery

## 2024-09-16 NOTE — Telephone Encounter (Signed)
 Sharlet RN I answered her questions

## 2024-09-16 NOTE — Telephone Encounter (Signed)
 Dr. Areatha pt - spoke w/the pt, he has an appointment on 09/29/24 to discuss knee replacement, possible January.  He is wanting to speak to Dr. Margery assistant while his nurse friend is there (she'll be there until 4pm today).  He stated he has some questions and she will be helping him.  [phone]

## 2024-09-20 ENCOUNTER — Ambulatory Visit: Admitting: Orthopedic Surgery

## 2024-09-29 ENCOUNTER — Ambulatory Visit: Admitting: Orthopedic Surgery

## 2024-09-29 ENCOUNTER — Encounter: Payer: Self-pay | Admitting: Orthopedic Surgery

## 2024-09-29 ENCOUNTER — Other Ambulatory Visit (INDEPENDENT_AMBULATORY_CARE_PROVIDER_SITE_OTHER): Payer: Self-pay

## 2024-09-29 VITALS — Ht 66.0 in | Wt 251.0 lb

## 2024-09-29 DIAGNOSIS — M1712 Unilateral primary osteoarthritis, left knee: Secondary | ICD-10-CM

## 2024-09-29 MED ORDER — BUPIVACAINE-MELOXICAM ER 400-12 MG/14ML IJ SOLN: 400.0000 mg | Freq: Once | INTRAMUSCULAR | Status: DC

## 2024-09-29 NOTE — Progress Notes (Signed)
 Patient ID: Peter Mora, male   DOB: 06/01/1946, 78 y.o.   MRN: [id number]  Chief Complaint  Patient presents with   Knee Pain    left    Current Outpatient Medications  Medication Instructions   azelastine  (ASTELIN ) 0.1 % nasal spray 2 sprays, Each Nare, 2 times daily PRN, Use in each nostril as directed   CALCIUM-MAGNESIUM-ZINC PO Daily   cetirizine  (ZYRTEC  ALLERGY ) 10 mg, Oral, Daily   cholecalciferol (VITAMIN D3) 1,000 Units, Daily   cyanocobalamin (VITAMIN B12) 1,000 mcg, Daily   doxycycline  (VIBRA -TABS) 100 mg, Oral, 2 times daily   fluticasone  (FLONASE ) 50 MCG/ACT nasal spray 2 sprays, Each Nare, Daily   meloxicam  (MOBIC ) 15 mg, Oral, Daily   Multiple Vitamin (MULTIVITAMIN WITH MINERALS) TABS tablet 1 tablet, Daily   pantoprazole  (PROTONIX ) 40 mg, Oral, Daily   Potassium (POTASSIMIN PO) Daily   sildenafil  (VIAGRA ) 100 mg, Oral, Daily PRN    No Known Allergies   This is a 78 year old male who presents for preop evaluation for left total knee arthroplasty  His main problem is that he is losing the ability to perform his normal activities of daily living which include some 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  He has increased pain when he standing for long periods of time and after 20 to 30 minutes he will have to sit down  He has had an injection or 2 and anti-inflammatories which controlled his symptoms for several years  He is ready to have replacement surgery    Review of Systems  Constitutional:  Negative for fever.  Respiratory:  Negative for shortness of breath.   Cardiovascular:  Negative for chest pain.  Skin: Negative.   Neurological:  Negative for tingling and sensory change.    Past Medical History:  Diagnosis Date   Arthritis    in both knees   Sleep apnea     Past Surgical History:  Procedure Laterality Date   BACK SURGERY     excision of melanoma   BIOPSY  04/07/2018   Procedure: BIOPSY;  Surgeon: Harvey Margo CROME, MD;  [facility];  Service: Endoscopy;;  random colon biopsy   BIOPSY  11/23/2019   Procedure: BIOPSY;  Surgeon: Harvey Margo CROME, MD;  [facility];  Service: Endoscopy;;  duodenal bulb, second portion of duodenal , duodenal nodule    COLONOSCOPY WITH PROPOFOL  N/A 04/07/2018   one 4 mm hyperplastic rectal polyp, redundant left colon, external and internal hemorrhoids   ESOPHAGOGASTRODUODENOSCOPY (EGD) WITH PROPOFOL  N/A 04/07/2018   Negative Barrett's. medium sized hiatal hernia. moderate gastritis/duodenitis   ESOPHAGOGASTRODUODENOSCOPY (EGD) WITH PROPOFOL  N/A 11/23/2019   Procedure: ESOPHAGOGASTRODUODENOSCOPY (EGD) WITH PROPOFOL ;  Surgeon: Harvey Margo CROME, MD;  [facility];  Service: Endoscopy;  Laterality: N/A;  11:15am   KNEE ARTHROSCOPY WITH MEDIAL MENISECTOMY Left 10/02/2017   Procedure: LEFT KNEE ARTHROSCOPY WITH PARTIAL MEDIAL MENISECTOMY;  Surgeon: Margrette Taft BRAVO, MD;  [facility];  Service: Orthopedics;  Laterality: Left;   POLYPECTOMY  04/07/2018   Procedure: POLYPECTOMY;  Surgeon: Harvey Margo CROME, MD;  [facility];  Service: Endoscopy;;  rectal polyp cs    PHYSICAL EXAM  Ht 5' 6 (1.676 m)   Wt 251 lb (113.9 kg)   BMI 40.51 kg/m  GENERAL appearance reveals no gross abnormalities, normal development grooming and hygiene   MENTAL STATUS we note that the patient is awake alert and oriented to person place and time MOOD/AFFECT ARE NORMAL  GAIT reveals no  limp in the effected limb  Left Knee Exam   Muscle Strength  The patient has normal left knee strength.  Tenderness  The patient is experiencing tenderness in the medial joint line.  Range of Motion  Extension:  0  Left knee flexion: 115.      VASC 2+ dorsalis pedis pulse normal capillary refill excellent warmth to the extremity  NEURO normal sensation and no pathologic reflexes  LYMPH deferred noncontributory  Encounter Diagnosis  Name Primary?   Primary osteoarthritis of  left knee Yes      DG Knee AP/LAT W/Sunrise Left Result Date: 09/29/2024 X-ray report Chief complaint preop imaging for left total knee 3 views Images 3 views of the knee Reading: The knee is in varus alignment is less than 10 degrees the medial compartment is narrowed significantly there is some sclerosis of the subchondral bone and a small medial osteophyte is noted.  The posterior surfaces have no osteophytes on them The patella looks to have some medial tilt and mild subluxation Impression: Grade 3 OA left knee with less than 10 degree varus alignment   The procedure has been fully reviewed with the patient; The risks and benefits of surgery have been discussed and explained and understood. Alternative treatment has also been reviewed, questions were encouraged and answered. The postoperative plan is also been reviewed.  Bleeding, infection, DVT, PE, stiffness postop  He has ability to get to the bottom floor of his home and the bed and bathroom are on that level and he will have a retired engineer, civil (consulting) helping him after surgery  Left total knee arthroplasty planned for January 2026

## 2024-09-29 NOTE — Patient Instructions (Addendum)
 Your surgery will be at [facility] on January 13th  by Dr Margrette  plan to be in hospital overnight. Get the walker from [facility] any time before surgery, take them the order we gave you today in office  The hospital will contact you with a preoperative appointment to discuss Anesthesia.  Please arrive on time or 15 minutes early for the preoperative appointment, they have a very tight schedule if you are late or do not come in your surgery will be cancelled.  The phone number for the preop area is [phone]. Please bring your medications with you for the appointment. They will tell you the arrival time for surgery and medication instructions when you have your preoperative evaluation. Do not wear nail polish the day of your surgery and if you take Phentermine you need to stop this medication ONE WEEK prior to your surgery. f you take Invokana, Farxiga, Jardiance, or Steglatro) - Hold 72 hours before the procedure.  If you take Ozempic,  Bydureo, Mounjaro, McClelland or Trulicity do not take for 8 days before your surgery. If you take Victoza, Rybelsis, Saxenda or Adlyxi stop 24 hours before the procedure. Please arrive at the hospital 2 hours before procedure if scheduled at 9:30 or later in the day or at the time the nurse tells you at your preoperative visit.   If you have my chart do not use the time given in my chart use the time given to you by the nurse during your preoperative visit.   Your surgery  time may change. Please be available for phone calls the day of your surgery and the day before. The Short Stay department may need to discuss changes about your surgery time. Not reaching the you could lead to procedure delays and possible cancellation.  You must have a ride home and someone to stay with you for 24 to 48 hours. The person taking you home will receive and sign for the your discharge instructions.  Please be prepared to give your support person's name and telephone number  to [facility] [employer]. Dr Margrette will need that name and phone number post procedure.   You will also get a call from a representative of [employer], they have a machine that you will use in the first few weeks after surgery. It is called a CPM.   You will have home physical therapy for 2 weeks after surgery, the home health agency will call you before or just following the surgery to set up visits. [facility] is the agency we normally use, unless you request another agency.   You will get a call also from outpatient therapy for therapy starting when the home therapy is done.  If you have questions or need to Reschedule the surgery, call the office ask for Pinki Rottman.

## 2024-09-29 NOTE — Progress Notes (Signed)
    09/29/2024   Chief Complaint  Patient presents with   Knee Pain    left    No diagnosis found.  What pharmacy do you use ? __[facility] 14_________________________  DOI/DOS/ Date: ongoing   Did you get better, worse or no change (Answer below)   Worse ready to discuss surgery

## 2024-09-29 NOTE — Addendum Note (Signed)
 Addended byBETHA JENEAN GREIG LELON on: 09/29/2024 12:12 PM   Modules accepted: Orders

## 2024-10-06 DIAGNOSIS — X32XXXD Exposure to sunlight, subsequent encounter: Secondary | ICD-10-CM | POA: Diagnosis not present

## 2024-10-06 DIAGNOSIS — Z85820 Personal history of malignant melanoma of skin: Secondary | ICD-10-CM | POA: Diagnosis not present

## 2024-10-06 DIAGNOSIS — L570 Actinic keratosis: Secondary | ICD-10-CM | POA: Diagnosis not present

## 2024-10-06 DIAGNOSIS — Z85828 Personal history of other malignant neoplasm of skin: Secondary | ICD-10-CM | POA: Diagnosis not present

## 2024-10-06 DIAGNOSIS — Z08 Encounter for follow-up examination after completed treatment for malignant neoplasm: Secondary | ICD-10-CM | POA: Diagnosis not present

## 2024-11-01 ENCOUNTER — Ambulatory Visit: Admitting: Orthopedic Surgery

## 2024-11-01 ENCOUNTER — Encounter: Payer: Self-pay | Admitting: Orthopedic Surgery

## 2024-11-01 DIAGNOSIS — M7702 Medial epicondylitis, left elbow: Secondary | ICD-10-CM

## 2024-11-01 DIAGNOSIS — M7701 Medial epicondylitis, right elbow: Secondary | ICD-10-CM

## 2024-11-01 MED ORDER — METHYLPREDNISOLONE ACETATE 40 MG/ML IJ SUSP
40.0000 mg | Freq: Once | INTRAMUSCULAR | Status: AC
  Administered 2024-11-01: 08:00:00 40 mg via INTRA_ARTICULAR

## 2024-11-01 NOTE — Progress Notes (Signed)
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 This was well tolerated.    A steroid injection was performed at RIGHT medial epicondyle  using 1% plain Lidocaine  and 40 mg of depomedrol. This was well tolerated.    Return prn  Knee surg sched for jan 13

## 2024-11-03 ENCOUNTER — Ambulatory Visit: Admitting: Internal Medicine

## 2024-11-03 ENCOUNTER — Encounter: Payer: Self-pay | Admitting: Internal Medicine

## 2024-11-03 VITALS — BP 156/86 | HR 92 | Ht 66.0 in | Wt 256.6 lb

## 2024-11-03 DIAGNOSIS — E66813 Obesity, class 3: Secondary | ICD-10-CM

## 2024-11-03 DIAGNOSIS — M1712 Unilateral primary osteoarthritis, left knee: Secondary | ICD-10-CM | POA: Diagnosis not present

## 2024-11-03 DIAGNOSIS — Z6841 Body Mass Index (BMI) 40.0 and over, adult: Secondary | ICD-10-CM

## 2024-11-03 DIAGNOSIS — E782 Mixed hyperlipidemia: Secondary | ICD-10-CM

## 2024-11-03 DIAGNOSIS — J321 Chronic frontal sinusitis: Secondary | ICD-10-CM

## 2024-11-03 DIAGNOSIS — R739 Hyperglycemia, unspecified: Secondary | ICD-10-CM

## 2024-11-03 DIAGNOSIS — I10 Essential (primary) hypertension: Secondary | ICD-10-CM

## 2024-11-03 DIAGNOSIS — E559 Vitamin D deficiency, unspecified: Secondary | ICD-10-CM

## 2024-11-03 DIAGNOSIS — Z0001 Encounter for general adult medical examination with abnormal findings: Secondary | ICD-10-CM | POA: Diagnosis not present

## 2024-11-03 DIAGNOSIS — Z125 Encounter for screening for malignant neoplasm of prostate: Secondary | ICD-10-CM | POA: Insufficient documentation

## 2024-11-03 MED ORDER — AMLODIPINE BESYLATE 5 MG PO TABS: 5.0000 mg | ORAL_TABLET | Freq: Every day | ORAL | 3 refills | Status: DC

## 2024-11-03 NOTE — Progress Notes (Signed)
 New Patient Office Visit  Subjective:  Patient ID: Peter Mora, male    DOB: [birth year]  Age: 78 y.o. MRN: [id number]  CC:  Chief Complaint  Patient presents with   Follow-up    6 month f/u    Knee Pain    Will be having knee surgery in January.    HPI Peter Mora is a 78 y.o. male with past medical history of GERD, IBS and obesity who presents for f/u of his chronic medical conditions.  HTN: His BP was elevated upon multiple measurements today.  He reports that he has had 6 cups of coffee this morning.  Denies any headache, dizziness, chest pain, dyspnea or palpitations currently.  GERD/gastritis and IBS: He takes pantoprazole  40 mg QD for it.  Denies any nausea or vomiting currently.  He reports improvement in bowel frequency since decreasing alcohol intake.  Chronic sinusitis: He has chronic nasal congestion.  He has dry cough, postnasal drip and throat irritation, but denies any fever, chills, dyspnea or wheezing currently.  He was evaluated by allergy  clinic, had allergy  testing, which showed allergies to multiple plants, dust, pollen, etc.  He still uses azelastine  nasal spray and has started Zyrtec . He works with wood, and tries to avoid direct wood dust exposure by wearing mask.  He agrees to continue Flonase , azelastine  and Zyrtec  for now.  OA of knee and elbow: Followed by orthopedic surgery.  He gets steroid injection in the elbow for it.  He currently takes meloxicam  15 mg once daily.  He is planned to get left TKA in 01/26.  Alcohol dependence: He used to take about 3-5 drinks of whiskey and 2 beers almost daily, but has cut down alcohol use significantly now.  He agrees to still cut down alcoholic drinks and continue multivitamin supplement for now.    Past Medical History:  Diagnosis Date   Arthritis    in both knees   Sleep apnea     Past Surgical History:  Procedure Laterality Date   BACK SURGERY     excision of melanoma   BIOPSY  04/07/2018    Procedure: BIOPSY;  Surgeon: Harvey Margo CROME, MD;  Location: [facility];  Service: Endoscopy;;  random colon biopsy   BIOPSY  11/23/2019   Procedure: BIOPSY;  Surgeon: Harvey Margo CROME, MD;  Location: [facility];  Service: Endoscopy;;  duodenal bulb, second portion of duodenal , duodenal nodule    COLONOSCOPY WITH PROPOFOL  N/A 04/07/2018   one 4 mm hyperplastic rectal polyp, redundant left colon, external and internal hemorrhoids   ESOPHAGOGASTRODUODENOSCOPY (EGD) WITH PROPOFOL  N/A 04/07/2018   Negative Barrett's. medium sized hiatal hernia. moderate gastritis/duodenitis   ESOPHAGOGASTRODUODENOSCOPY (EGD) WITH PROPOFOL  N/A 11/23/2019   Procedure: ESOPHAGOGASTRODUODENOSCOPY (EGD) WITH PROPOFOL ;  Surgeon: Harvey Margo CROME, MD;  Location: [facility];  Service: Endoscopy;  Laterality: N/A;  11:15am   KNEE ARTHROSCOPY WITH MEDIAL MENISECTOMY Left 10/02/2017   Procedure: LEFT KNEE ARTHROSCOPY WITH PARTIAL MEDIAL MENISECTOMY;  Surgeon: Margrette Taft BRAVO, MD;  Location: [facility];  Service: Orthopedics;  Laterality: Left;   POLYPECTOMY  04/07/2018   Procedure: POLYPECTOMY;  Surgeon: Harvey Margo CROME, MD;  Location: [facility];  Service: Endoscopy;;  rectal polyp cs    Family History  Problem Relation Age of Onset   Cancer Mother        esophageal   Cancer Sister        colon and breast    Cancer Brother  colon   Cancer Father 50       agressive lung cancer    Social History   Socioeconomic History   Marital status: Single    Spouse name: [name]   Number of children: 3   Years of education: 16   Highest education level: Bachelor's degree (e.g., BA, AB, BS)  Occupational History   Occupation: woodworker    Comment: retired   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  Tobacco Use   Smoking status: Former    Current packs/day: 0.00    Types: Cigarettes    Start date: 09/16/1967    Quit date: 09/15/2002    Years since quitting: 22.1   Smokeless tobacco: Never  Vaping Use    Vaping status: Never Used  Substance and Sexual Activity   Alcohol use: Yes    Alcohol/week: 42.0 standard drinks of alcohol    Types: 21 Cans of beer, 21 Shots of liquor per week    Comment: 6 drinks per day   Drug use: No   Sexual activity: Not Currently    Birth control/protection: None  Other Topics Concern   Not on file  Social History Narrative   Lives with second wife Rico, chinese woman   She travels a lot   Retired printmaker   No reg exercise   Social Drivers of Health   Tobacco Use: Medium Risk (11/03/2024)   Patient History    Smoking Tobacco Use: Former    Smokeless Tobacco Use: Never    Passive Exposure: Not on Actuary Strain: Low Risk (05/06/2024)   Overall Financial Resource Strain (CARDIA)    Difficulty of Paying Living Expenses: Not hard at all  Food Insecurity: No Food Insecurity (05/06/2024)   Epic    Worried About Programme Researcher, Broadcasting/film/video in the Last Year: Never true    Ran Out of Food in the Last Year: Never true  Transportation Needs: No Transportation Needs (05/06/2024)   Epic    Lack of Transportation (Medical): No    Lack of Transportation (Non-Medical): No  Physical Activity: Inactive (05/06/2024)   Exercise Vital Sign    Days of Exercise per Week: 0 days    Minutes of Exercise per Session: 0 min  Stress: No Stress Concern Present (05/06/2024)   [employer] of Occupational Health - Occupational Stress Questionnaire    Feeling of Stress: Not at all  Social Connections: Socially Isolated (05/06/2024)   Social Connection and Isolation Panel    Frequency of Communication with Friends and Family: More than three times a week    Frequency of Social Gatherings with Friends and Family: More than three times a week    Attends Religious Services: Never    Database Administrator or Organizations: No    Attends Banker Meetings: Never    Marital Status: Divorced  Catering Manager Violence: Not At Risk (05/06/2024)    Epic    Fear of Current or Ex-Partner: No    Emotionally Abused: No    Physically Abused: No    Sexually Abused: No  Depression (PHQ2-9): Low Risk (11/03/2024)   Depression (PHQ2-9)    PHQ-2 Score: 0  Alcohol Screen: Low Risk (05/06/2024)   Alcohol Screen    Last Alcohol Screening Score (AUDIT): 5  Housing: Unknown (05/06/2024)   Epic    Unable to Pay for Housing in the Last Year: No    Number of Times Moved in the Last Year: Not on file    Homeless  in the Last Year: No  Utilities: Not At Risk (05/06/2024)   Epic    Threatened with loss of utilities: No  Health Literacy: Adequate Health Literacy (05/06/2024)   B1300 Health Literacy    Frequency of need for help with medical instructions: Never    ROS Review of Systems  Constitutional:  Negative for chills and fever.  HENT:  Negative for congestion and sore throat.   Eyes:  Negative for pain and discharge.  Respiratory:  Negative for cough and shortness of breath.   Cardiovascular:  Negative for chest pain and palpitations.  Gastrointestinal:  Negative for diarrhea, nausea and vomiting.  Endocrine: Negative for polydipsia and polyuria.  Genitourinary:  Negative for dysuria and hematuria.  Musculoskeletal:  Positive for arthralgias (L elbow and left knee). Negative for neck pain and neck stiffness.  Skin:  Negative for rash.  Neurological:  Negative for dizziness, weakness, numbness and headaches.  Psychiatric/Behavioral:  Negative for agitation and behavioral problems.     Objective:   Today's Vitals: BP (!) 156/86 (BP Location: Left Arm)   Pulse 92   Ht 5' 6 (1.676 m)   Wt 256 lb 9.6 oz (116.4 kg)   SpO2 97%   BMI 41.42 kg/m   Physical Exam Vitals reviewed.  Constitutional:      General: He is not in acute distress.    Appearance: He is not diaphoretic.  HENT:     Head: Normocephalic and atraumatic.     Nose: Nose normal.     Mouth/Throat:     Mouth: Mucous membranes are moist.  Eyes:     General: No  scleral icterus.    Extraocular Movements: Extraocular movements intact.  Cardiovascular:     Rate and Rhythm: Normal rate and regular rhythm.     Heart sounds: Normal heart sounds. No murmur heard. Pulmonary:     Breath sounds: Normal breath sounds. No wheezing or rales.  Musculoskeletal:     Cervical back: Neck supple. No tenderness.     Left knee: Swelling present. Tenderness present.     Right lower leg: No edema.     Left lower leg: No edema.     Comments: Left knee brace in place  Skin:    General: Skin is warm.     Findings: No rash.  Neurological:     General: No focal deficit present.     Mental Status: He is alert and oriented to person, place, and time.  Psychiatric:        Mood and Affect: Mood normal.        Behavior: Behavior normal.     Assessment & Plan:   Problem List Items Addressed This Visit       Cardiovascular and Mediastinum   Essential hypertension   BP Readings from Last 1 Encounters:  11/03/24 (!) 156/86   Uncontrolled Started amlodipine  5 mg QD Needs to cut down caffeinated drinks and alcohol intake Counseled for compliance with the medications Advised DASH diet and moderate exercise/walking, at least 150 mins/week      Relevant Medications   amLODipine  (NORVASC ) 5 MG tablet   Other Relevant Orders   TSH   CMP14+EGFR   CBC with Differential/Platelet     Respiratory   Chronic frontal sinusitis   Continue Flonase  for allergies Referred to allergy  clinic for allergy  testing - added azelastine  and Zyrtec , needs to continue        Musculoskeletal and Integument   Primary osteoarthritis of left knee  Followed by Orthopedic surgery On Meloxicam  15 mg once daily, advised to alternate with Tylenol  arthritis Medically optimized for TKA with moderate risk        Other   Mixed hyperlipidemia   Check lipid profile Advised to follow DASH diet      Relevant Medications   amLODipine  (NORVASC ) 5 MG tablet   Other Relevant Orders    Lipid panel   Encounter for general adult medical examination with abnormal findings - Primary   Physical exam as documented. Fasting blood tests ordered today. Denies any vaccine.      Prostate cancer screening   Ordered PSA after discussing its limitations for prostate cancer screening, including false positive results leading to additional investigations.      Relevant Orders   PSA   Other Visit Diagnoses       Hyperglycemia       Relevant Orders   Hemoglobin A1c   CMP14+EGFR     Vitamin D  deficiency       Relevant Orders   VITAMIN D  25 Hydroxy (Vit-D Deficiency, Fractures)        Outpatient Encounter Medications as of 11/03/2024  Medication Sig   amLODipine  (NORVASC ) 5 MG tablet Take 1 tablet (5 mg total) by mouth daily.   azelastine  (ASTELIN ) 0.1 % nasal spray Place 2 sprays into both nostrils 2 (two) times daily as needed. Use in each nostril as directed   CALCIUM-MAGNESIUM-ZINC PO Take by mouth daily.   cetirizine  (ZYRTEC  ALLERGY ) 10 MG tablet Take 1 tablet (10 mg total) by mouth daily.   cholecalciferol (VITAMIN D3) 25 MCG (1000 UT) tablet Take 1,000 Units by mouth daily.   fluticasone  (FLONASE ) 50 MCG/ACT nasal spray Place 2 sprays into both nostrils daily.   meloxicam  (MOBIC ) 15 MG tablet Take 1 tablet by mouth once daily   Multiple Vitamin (MULTIVITAMIN WITH MINERALS) TABS tablet Take 1 tablet by mouth daily.   pantoprazole  (PROTONIX ) 40 MG tablet Take 1 tablet (40 mg total) by mouth daily.   Potassium (POTASSIMIN PO) Take by mouth daily.   sildenafil  (VIAGRA ) 100 MG tablet Take 1 tablet (100 mg total) by mouth daily as needed for erectile dysfunction.   vitamin B-12 (CYANOCOBALAMIN) 1000 MCG tablet Take 1,000 mcg by mouth daily.   [DISCONTINUED] doxycycline  (VIBRA -TABS) 100 MG tablet Take 1 tablet (100 mg total) by mouth 2 (two) times daily.   Facility-Administered Encounter Medications as of 11/03/2024  Medication   bupivacaine -meloxicam  ER (ZYNRELEF )  injection 400 mg    Follow-up: Return in about 3 months (around 02/01/2025) for HTN.   Suzzane MARLA Blanch, MD

## 2024-11-03 NOTE — Assessment & Plan Note (Signed)
 BP Readings from Last 1 Encounters:  11/03/24 (!) 156/86   Uncontrolled Started amlodipine  5 mg QD Needs to cut down caffeinated drinks and alcohol intake Counseled for compliance with the medications Advised DASH diet and moderate exercise/walking, at least 150 mins/week

## 2024-11-03 NOTE — Assessment & Plan Note (Signed)
 Physical exam as documented. Fasting blood tests ordered today. Denies any vaccine.

## 2024-11-03 NOTE — Assessment & Plan Note (Signed)
 Ordered PSA after discussing its limitations for prostate cancer screening, including false positive results leading to additional investigations.

## 2024-11-03 NOTE — Assessment & Plan Note (Signed)
 Followed by Orthopedic surgery On Meloxicam  15 mg once daily, advised to alternate with Tylenol  arthritis Medically optimized for TKA with moderate risk

## 2024-11-03 NOTE — Assessment & Plan Note (Signed)
 Continue Flonase for allergies Referred to allergy clinic for allergy testing - added azelastine and Zyrtec, needs to continue

## 2024-11-03 NOTE — Assessment & Plan Note (Signed)
Check lipid profile Advised to follow DASH diet 

## 2024-11-03 NOTE — Patient Instructions (Signed)
 Please start taking Amlodipine  as prescribed.  Please continue to take other medications as prescribed.  Please continue to follow low salt diet and perform moderate exercise/walking at least 150 mins/week.

## 2024-11-04 ENCOUNTER — Other Ambulatory Visit: Payer: Self-pay | Admitting: Orthopedic Surgery

## 2024-11-04 DIAGNOSIS — M1712 Unilateral primary osteoarthritis, left knee: Secondary | ICD-10-CM

## 2024-11-05 ENCOUNTER — Other Ambulatory Visit: Payer: Self-pay | Admitting: Internal Medicine

## 2024-11-05 DIAGNOSIS — I10 Essential (primary) hypertension: Secondary | ICD-10-CM

## 2024-11-06 ENCOUNTER — Other Ambulatory Visit: Payer: Self-pay | Admitting: Orthopedic Surgery

## 2024-11-08 ENCOUNTER — Encounter: Payer: Self-pay | Admitting: Internal Medicine

## 2024-11-08 ENCOUNTER — Other Ambulatory Visit: Payer: Self-pay | Admitting: Internal Medicine

## 2024-11-08 DIAGNOSIS — I10 Essential (primary) hypertension: Secondary | ICD-10-CM

## 2024-11-08 MED ORDER — OLMESARTAN MEDOXOMIL 20 MG PO TABS: 10.0000 mg | ORAL_TABLET | Freq: Every day | ORAL | 1 refills | Status: AC

## 2024-11-20 ENCOUNTER — Other Ambulatory Visit: Payer: Self-pay | Admitting: Internal Medicine

## 2024-11-20 DIAGNOSIS — K219 Gastro-esophageal reflux disease without esophagitis: Secondary | ICD-10-CM

## 2024-11-22 ENCOUNTER — Ambulatory Visit: Admitting: Orthopedic Surgery

## 2024-11-23 NOTE — Patient Instructions (Signed)
 "        Peter Mora  11/23/2024     @[email or web address] @   Your procedure is scheduled on  11/30/2024.   Report to [facility] at  0600  A.M.   Call this number if you have problems the morning of surgery:  [phone]  If you experience any cold or flu symptoms such as cough, fever, chills, shortness of breath, etc. between now and your scheduled surgery, please notify us  at the above number.   Remember:  Do not eat after midnight.   You may drink clear liquids until 0330 am on 11/30/2024.     Clear liquids allowed are:                    Water , Carbonated beverages (diabetics please choose diet or no sugar options), Black Coffee Only (No creamer, milk or cream, including half & half and powdered creamer), and Clear Sports drink (No red color; diabetics please choose diet or no sugar options)    Take these medicines the morning of surgery with A SIP OF WATER                                      meloxicam , pantoprazole .    Do not wear jewelry, make-up or nail polish, including gel polish,  artificial nails, or any other type of covering on natural nails (fingers and  toes).  Do not wear lotions, powders, or perfumes, or deodorant.  Do not shave 48 hours prior to surgery.  Men may shave face and neck.  Do not bring valuables to the hospital.  [facility] is not responsible for any belongings or valuables.  Contacts, dentures or bridgework may not be worn into surgery.  Leave your suitcase in the car.  After surgery it may be brought to your room.  For patients admitted to the hospital, discharge time will be determined by your treatment team.  Patients discharged the day of surgery will not be allowed to drive home.    Special instructions:   DO NOT smoke tobacco or vape for 24 hours before your procedure.  Please read over the following fact sheets that you were given. Pain Booklet, Coughing and Deep Breathing, Blood Transfusion Information, Total Joint Packet,  Surgical Site Infection Prevention, Anesthesia Post-op Instructions, and Care and Recovery After Surgery      Total Knee Replacement Surgery: What to Know After After a total knee replacement, it's common to have: Redness, pain, and swelling at the cut from surgery area. This is also called the incision area. Stiffness. Discomfort. A small amount of blood or clear fluid coming from your cut. Follow these instructions at home: Medicines Take your medicines only as told. You may need to take steps to help treat or prevent trouble pooping (constipation), such as: Taking medicines to help you poop. Eating foods high in fiber, like beans, whole grains, and fresh fruits and vegetables. Drinking more fluids as told. Ask your health care provider if it's safe to drive or use machines while taking your medicine. Bathing Do not take baths, swim, or use a hot tub until you're told it's OK. Ask if you can shower. If you bandage isn't waterproof: Do not let it get wet. Cover it when you take a bath or shower. Use a cover that doesn't let any water  in. Incision care Take care of your cut from surgery as told.  Make sure you: Wash your hands with soap and water  for at least 20 seconds before and after you change your bandage. If you can't use soap and water , use hand sanitizer. Change your bandage. Leave stitches or skin glue alone. Leave tape strips alone unless you're told to take them off. You may trim the edges of the tape strips if they curl up. Check the area around your cut every day for signs of infection. Check for: More redness, swelling, or pain. More fluid or blood. Warmth. Pus or a bad smell.  Activity Rest as told. Get up to take short walks at least every 2 hours during the day. This helps you breathe better and keeps your blood flowing. Ask for help if you feel weak or unsteady. Follow instructions from your provider about using a walker, crutches, or a cane. You may use your  legs to support your body weight as told by your provider. Follow instructions about how much weight you may safely support on your affected leg. You may be shown how to get out of a bed and chair and how to go up and down stairs. You will first do this with a walker, crutches, or a cane. Once you are able to walk without a limp, you may stop using a walker, crutches, or a cane, as told by your provider. Do exercises as told by your provider to help restore your strength and knee movement. Exercises will start as soon as possible after your surgery. You'll work with a physical therapist for up to a few months after your surgery. Avoid high-impact activities. Avoid running, jumping rope, and doing jumping jacks. Do not play contact sports until your provider approves. Ask what things are safe for you to do at home. Ask when you can go back to work or school. Managing pain, stiffness, and swelling  Use ice or an ice pack as told. Put ice in a plastic bag or use the cold flow pad or cold therapy unit that you were given. Place a towel between your skin and the bag or device. Leave the ice on for 20 minutes, 2-3 times a day. If your skin turns red, take off the ice right away to prevent skin damage. The risk of damage is higher if you can't feel pain, heat, or cold. Move your toes often to reduce stiffness and swelling. Raise your leg above the level of your heart while you are sitting or lying down. Use several pillows to keep your leg straight. Do not put a pillow just under the knee. If the knee is bent for a long time, this may lead to stiffness. Wear an elastic knee support as told by your provider. Safety To help prevent falls, keep floors clear. Put things that you may need within easy reach. Wear an apron or tool belt with pockets for carrying things. This leaves your hands free to help with your balance. Ask your provider when it is safe to drive. General instructions Wear compression  stockings to reduce swelling and prevent blood clots in your legs. Keep doing breathing exercises as told. This helps prevent lung infection. Do not smoke, vape, or use nicotine or tobacco. Do not have dental work or cleanings for at least 3 months. Ask your provider if you need to take antibiotics before you have dental work or have your teeth cleaned. Tell your dentist about your new joint. Keep all follow-up visits. Your provider will need to check how your knee is healing.  Contact a health care provider if: You have a fever or chills. You have a cough or feel short of breath. You have very bad pain and medicine is not helping your pain. You have any signs of infection. You fall. You have increased swelling, redness, and calf pain. Your cut from surgery breaks open after your stitches or staples are taken out. Get help right away if: You have trouble breathing. You have a fast and irregular heartbeat. You have chest pain. These symptoms may be an emergency. Call 911 right away. Do not wait to see if the symptoms will go away. Do not drive yourself to the hospital. This information is not intended to replace advice given to you by your health care provider. Make sure you discuss any questions you have with your health care provider. Document Revised: 09/04/2023 Document Reviewed: 04/10/2023 Elsevier Patient Education  2024 [employer]General Anesthesia, Adult, Care After The following information offers guidance on how to care for yourself after your procedure. Your health care provider may also give you more specific instructions. If you have problems or questions, contact your health care provider. What can I expect after the procedure? After the procedure, it is common for people to: Have pain or discomfort at the IV site. Have nausea or vomiting. Have a sore throat or hoarseness. Have trouble concentrating. Feel cold or chills. Feel weak, sleepy, or tired (fatigue). Have  soreness and body aches. These can affect parts of the body that were not involved in surgery. Follow these instructions at home: For the time period you were told by your health care provider:  Rest. Do not participate in activities where you could fall or become injured. Do not drive or use machinery. Do not drink alcohol. Do not take sleeping pills or medicines that cause drowsiness. Do not make important decisions or sign legal documents. Do not take care of children on your own. General instructions Drink enough fluid to keep your urine pale yellow. If you have sleep apnea, surgery and certain medicines can increase your risk for breathing problems. Follow instructions from your health care provider about wearing your sleep device: Anytime you are sleeping, including during daytime naps. While taking prescription pain medicines, sleeping medicines, or medicines that make you drowsy. Return to your normal activities as told by your health care provider. Ask your health care provider what activities are safe for you. Take over-the-counter and prescription medicines only as told by your health care provider. Do not use any products that contain nicotine or tobacco. These products include cigarettes, chewing tobacco, and vaping devices, such as e-cigarettes. These can delay incision healing after surgery. If you need help quitting, ask your health care provider. Contact a health care provider if: You have nausea or vomiting that does not get better with medicine. You vomit every time you eat or drink. You have pain that does not get better with medicine. You cannot urinate or have bloody urine. You develop a skin rash. You have a fever. Get help right away if: You have trouble breathing. You have chest pain. You vomit blood. These symptoms may be an emergency. Get help right away. Call 911. Do not wait to see if the symptoms will go away. Do not drive yourself to the  hospital. Summary After the procedure, it is common to have a sore throat, hoarseness, nausea, vomiting, or to feel weak, sleepy, or fatigue. For the time period you were told by your health care provider, do not drive or  use machinery. Get help right away if you have difficulty breathing, have chest pain, or vomit blood. These symptoms may be an emergency. This information is not intended to replace advice given to you by your health care provider. Make sure you discuss any questions you have with your health care provider. Document Revised: 02/01/2022 Document Reviewed: 02/01/2022 Elsevier Patient Education  2024 [employer].How to Use Chlorhexidine  at Home in the Shower Chlorhexidine  gluconate (CHG) is a germ-killing (antiseptic) wash that's used to clean the skin. It can get rid of the germs that normally live on the skin and can keep them away for about 24 hours. If you're having surgery, you may be told to shower with CHG at home the night before surgery. This can help lower your risk for infection. To use CHG wash in the shower, follow the steps below. Supplies needed: CHG body wash. Clean washcloth. Clean towel. How to use CHG in the shower Follow these steps unless you're told to use CHG in a different way: Start the shower. Use your normal soap and shampoo to wash your face and hair. Turn off the shower or move out of the shower stream. Pour CHG onto a clean washcloth. Do not use any type of brush or rough sponge. Start at your neck, washing your body down to your toes. Make sure you: Wash the part of your body where the surgery will be done for at least 1 minute. Do not scrub. Do not use CHG on your head or face unless your health care provider tells you to. If it gets into your ears or eyes, rinse them well with water . Do not wash your genitals with CHG. Wash your back and under your arms. Make sure to wash skin folds. Let the CHG sit on your skin for 1-2 minutes or as long as  told. Rinse your entire body in the shower, including all body creases and folds. Turn off the shower. Dry off with a clean towel. Do not put anything on your skin afterward, such as powder, lotion, or perfume. Put on clean clothes or pajamas. If it's the night before surgery, sleep in clean sheets. General tips Use CHG only as told, and follow the instructions on the label. Use the full amount of CHG as told. This is often one bottle. Do not smoke and stay away from flames after using CHG. Your skin may feel sticky after using CHG. This is normal. The sticky feeling will go away as the CHG dries. Do not use CHG: If you have a chlorhexidine  allergy  or have reacted to chlorhexidine  in the past. On open wounds or areas of skin that have broken skin, cuts, or scrapes. On babies younger than 66 months of age. Contact a health care provider if: You have questions about using CHG. Your skin gets irritated or itchy. You have a rash after using CHG. You swallow any CHG. Call your local poison control center [phone] in the [place] Your eyes itch badly, or they become very red or swollen. Your hearing changes. You have trouble seeing. If you can't reach your provider, go to an urgent care or emergency room. Do not drive yourself. Get help right away if: You have swelling or tingling in your mouth or throat. You make high-pitched whistling sounds when you breathe, most often when you breathe out (wheeze). You have trouble breathing. These symptoms may be an emergency. Call 911 right away. Do not wait to see if the symptoms will go away. Do  not drive yourself to the hospital. This information is not intended to replace advice given to you by your health care provider. Make sure you discuss any questions you have with your health care provider. Document Revised: 05/20/2023 Document Reviewed: 05/16/2022 Elsevier Patient Education  2024 [employer].How to Use an Incentive Spirometer An  incentive spirometer is a tool that measures how well you are filling your lungs with each breath. Learning to take long, deep breaths using this tool can help you keep your lungs clear and active. This may help to reverse or lessen your chance of developing breathing (pulmonary) problems, especially infection. You may be asked to use a spirometer: After a surgery. If you have a lung problem or a history of smoking. After a long period of time when you have been unable to move or be active. If the spirometer includes an indicator to show the highest number that you have reached, your health care provider or respiratory therapist will help you set a goal. Keep a log of your progress as told by your health care provider. What are the risks? Breathing too quickly may cause dizziness or cause you to pass out. Take your time so you do not get dizzy or light-headed. If you are in pain, you may need to take pain medicine before doing incentive spirometry. It is harder to take a deep breath if you are having pain. How to use your incentive spirometer  Sit up on the edge of your bed or on a chair. Hold the incentive spirometer so that it is in an upright position. Before you use the spirometer, breathe out normally. Place the mouthpiece in your mouth. Make sure your lips are closed tightly around it. Breathe in slowly and as deeply as you can through your mouth, causing the piston or the ball to rise toward the top of the chamber. Hold your breath for 3-5 seconds, or for as long as possible. If the spirometer includes a coach indicator, use this to guide you in breathing. Slow down your breathing if the indicator goes above the marked areas. Remove the mouthpiece from your mouth and breathe out normally. The piston or ball will return to the bottom of the chamber. Rest for a few seconds, then repeat the steps 10 or more times. Take your time and take a few normal breaths between deep breaths so that you do  not get dizzy or light-headed. Do this every 1-2 hours when you are awake. If the spirometer includes a goal marker to show the highest number you have reached (best effort), use this as a goal to work toward during each repetition. After each set of 10 deep breaths, cough a few times. This will help to make sure that your lungs are clear. If you have an incision on your chest or abdomen from surgery, place a pillow or a rolled-up towel firmly against the incision when you cough. This can help to reduce pain while taking deep breaths and coughing. General tips When you are able to get out of bed: Walk around often. Continue to take deep breaths and cough in order to clear your lungs. Keep using the incentive spirometer until your health care provider says it is okay to stop using it. If you have been in the hospital, you may be told to keep using the spirometer at home. Contact a health care provider if: You are having difficulty using the spirometer. You have trouble using the spirometer as often as instructed. Your  pain medicine is not giving enough relief for you to use the spirometer as told. You have a fever. Get help right away if: You develop shortness of breath. You develop a cough with bloody mucus from the lungs. You have fluid or blood coming from an incision site after you cough. Summary An incentive spirometer is a tool that can help you learn to take long, deep breaths to keep your lungs clear and active. You may be asked to use a spirometer after a surgery, if you have a lung problem or a history of smoking, or if you have been inactive for a long period of time. Use your incentive spirometer as instructed every 1-2 hours while you are awake. If you have an incision on your chest or abdomen, place a pillow or a rolled-up towel firmly against your incision when you cough. This will help to reduce pain. Get help right away if you have shortness of breath, you cough up bloody  mucus, or blood comes from your incision when you cough. This information is not intended to replace advice given to you by your health care provider. Make sure you discuss any questions you have with your health care provider. Document Revised: 09/12/2023 Document Reviewed: 09/12/2023 Elsevier Patient Education  2024 [employer]. "

## 2024-11-24 ENCOUNTER — Encounter (HOSPITAL_COMMUNITY)
Admission: RE | Admit: 2024-11-24 | Discharge: 2024-11-24 | Disposition: A | Discharge status: Home / Self Care | Source: Ambulatory Visit | Attending: Orthopedic Surgery | Admitting: Orthopedic Surgery

## 2024-11-24 ENCOUNTER — Other Ambulatory Visit: Payer: Self-pay

## 2024-11-24 ENCOUNTER — Encounter (HOSPITAL_COMMUNITY): Payer: Self-pay

## 2024-11-24 VITALS — BP 156/86 | HR 92 | Resp 18 | Ht 66.0 in | Wt 256.6 lb

## 2024-11-24 DIAGNOSIS — M1712 Unilateral primary osteoarthritis, left knee: Secondary | ICD-10-CM | POA: Insufficient documentation

## 2024-11-24 DIAGNOSIS — Z01812 Encounter for preprocedural laboratory examination: Secondary | ICD-10-CM | POA: Insufficient documentation

## 2024-11-24 DIAGNOSIS — Z01818 Encounter for other preprocedural examination: Secondary | ICD-10-CM

## 2024-11-24 HISTORY — DX: Gastro-esophageal reflux disease without esophagitis: K21.9

## 2024-11-24 LAB — CBC WITH DIFFERENTIAL/PLATELET
Abs Immature Granulocytes: 0.01 K/uL (ref 0.00–0.07)
Basophils Absolute: 0 K/uL (ref 0.0–0.1)
Basophils Relative: 1 %
Eosinophils Absolute: 0.3 K/uL (ref 0.0–0.5)
Eosinophils Relative: 5 %
HCT: 44.7 % (ref 39.0–52.0)
Hemoglobin: 15.5 g/dL (ref 13.0–17.0)
Immature Granulocytes: 0 %
Lymphocytes Relative: 21 %
Lymphs Abs: 1.3 K/uL (ref 0.7–4.0)
MCH: 30.6 pg (ref 26.0–34.0)
MCHC: 34.7 g/dL (ref 30.0–36.0)
MCV: 88.3 fL (ref 80.0–100.0)
Monocytes Absolute: 0.6 K/uL (ref 0.1–1.0)
Monocytes Relative: 9 %
Neutro Abs: 4.2 K/uL (ref 1.7–7.7)
Neutrophils Relative %: 64 %
Platelets: 183 K/uL (ref 150–400)
RBC: 5.06 MIL/uL (ref 4.22–5.81)
RDW: 13.5 % (ref 11.5–15.5)
WBC: 6.4 K/uL (ref 4.0–10.5)
nRBC: 0 % (ref 0.0–0.2)

## 2024-11-24 LAB — BASIC METABOLIC PANEL WITH GFR
Anion gap: 9 (ref 5–15)
BUN: 26 mg/dL — ABNORMAL HIGH (ref 8–23)
CO2: 26 mmol/L (ref 22–32)
Calcium: 9.2 mg/dL (ref 8.9–10.3)
Chloride: 104 mmol/L (ref 98–111)
Creatinine, Ser: 0.86 mg/dL (ref 0.61–1.24)
GFR, Estimated: >60 mL/min
Glucose, Bld: 88 mg/dL (ref 70–99)
Potassium: 3.9 mmol/L (ref 3.5–5.1)
Sodium: 139 mmol/L (ref 135–145)

## 2024-11-24 LAB — PREPARE RBC (CROSSMATCH)

## 2024-11-24 LAB — SURGICAL PCR SCREEN
MRSA, PCR: NEGATIVE
Staphylococcus aureus: NEGATIVE

## 2024-11-25 NOTE — H&P (Signed)
 TOTAL KNEE ADMISSION H&P  Patient is being admitted for left total knee arthroplasty.  Subjective:  Chief Complaint:left knee pain.  HPI: Peter Mora, 79 y.o. male, has a history of pain and functional disability in the left knee due to arthritis and has failed non-surgical conservative treatments for greater than 12 weeks to includeNSAID's and/or analgesics, corticosteriod injections, viscosupplementation injections, use of assistive devices, and activity modification.  Onset of symptoms was gradual, starting 8 years ago with gradually worsening course since that time. The patient noted prior procedures on the knee to include  arthroscopy on the left knee(s).  Patient currently rates pain in the left knee(s) AS INCREASING WITH activity. Patient has worsening of pain with activity and weight bearing and pain that interferes with activities of daily living.  Patient has evidence of subchondral cysts, subchondral sclerosis, periarticular osteophytes, and joint space narrowing by imaging studies.  There is no active infection.  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   Mixed hyperlipidemia 10/03/2023   Uncomplicated alcohol dependence (HCC) 10/03/2023   Essential hypertension 10/03/2023   Primary osteoarthritis of left knee 10/03/2023   Diarrhea 08/25/2019   GERD (gastroesophageal reflux disease) 08/10/2018   Polyp of rectum    Change in bowel habits 03/05/2018   OSA (obstructive sleep apnea) 02/02/2018   Snoring 01/01/2018   Colon polyp 01/01/2018   Family history of colon cancer 01/01/2018   Heavy drinker of alcohol 01/01/2018   Body mass index (BMI) of 40.0-44.9 in adult (HCC) 01/01/2018    Abnormal EKG 01/01/2018   S/P left knee arthroscopy 10/02/17 10/06/2017   Seborrheic keratosis 07/25/2017   Past Medical History:  Diagnosis Date   Arthritis    in both knees   GERD (gastroesophageal reflux disease)    Sleep apnea     Past Surgical History:  Procedure Laterality Date   BACK SURGERY     excision of melanoma   BIOPSY  04/07/2018   Procedure: BIOPSY;  Surgeon: Harvey Margo CROME, MD;  Location: [facility];  Service: Endoscopy;;  random colon biopsy   BIOPSY  11/23/2019   Procedure: BIOPSY;  Surgeon: Harvey Margo CROME, MD;  Location: [facility];  Service: Endoscopy;;  duodenal bulb, second portion of duodenal , duodenal nodule    COLONOSCOPY WITH PROPOFOL  N/A 04/07/2018   one 4 mm hyperplastic rectal polyp, redundant left colon, external and internal hemorrhoids   ESOPHAGOGASTRODUODENOSCOPY (EGD) WITH PROPOFOL  N/A 04/07/2018   Negative Barrett's. medium sized hiatal hernia. moderate gastritis/duodenitis   ESOPHAGOGASTRODUODENOSCOPY (EGD) WITH PROPOFOL  N/A 11/23/2019   Procedure: ESOPHAGOGASTRODUODENOSCOPY (EGD) WITH PROPOFOL ;  Surgeon: Harvey Margo CROME, MD;  Location: [facility];  Service: Endoscopy;  Laterality: N/A;  11:15am   KNEE ARTHROSCOPY WITH MEDIAL MENISECTOMY Left 10/02/2017   Procedure: LEFT KNEE ARTHROSCOPY WITH PARTIAL MEDIAL MENISECTOMY;  Surgeon: Margrette Taft BRAVO, MD;  Location: [facility];  Service: Orthopedics;  Laterality: Left;   POLYPECTOMY  04/07/2018   Procedure: POLYPECTOMY;  Surgeon: Harvey Margo CROME, MD;  Location: [facility];  Service: Endoscopy;;  rectal polyp cs    Current Facility-Administered Medications  Medication Dose Route Frequency Provider Last Rate Last Admin   bupivacaine -meloxicam  ER (ZYNRELEF ) injection 400 mg  400 mg Infiltration Once Addley Ballinger,  Taft BRAVO, MD       Current Outpatient Medications  Medication Sig Dispense Refill Last Dose/Taking   azelastine  (ASTELIN ) 0.1 % nasal spray Place 2 sprays into both nostrils 2 (two)  times daily as needed. Use in each nostril as directed 30 mL 11 Taking As Needed   CALCIUM -MAGNESIUM-ZINC PO Take 1 tablet by mouth daily.   Taking   cetirizine  (ZYRTEC  ALLERGY ) 10 MG tablet Take 1 tablet (10 mg total) by mouth daily. 90 tablet 3 Taking   cholecalciferol  (VITAMIN D3) 25 MCG (1000 UT) tablet Take 1,000 Units by mouth daily.   Taking   meloxicam  (MOBIC ) 15 MG tablet Take 1 tablet by mouth once daily 90 tablet 0 Taking   Multiple Vitamin (MULTIVITAMIN WITH MINERALS) TABS tablet Take 1 tablet by mouth daily.   Taking   olmesartan  (BENICAR ) 20 MG tablet Take 0.5 tablets (10 mg total) by mouth daily. 30 tablet 1 Taking   pantoprazole  (PROTONIX ) 40 MG tablet Take 1 tablet by mouth once daily 90 tablet 0 Taking   Potassium (POTASSIMIN PO) Take 1 tablet by mouth daily.   Taking   vitamin B-12 (CYANOCOBALAMIN ) 1000 MCG tablet Take 1,000 mcg by mouth daily.   Taking   fluticasone  (FLONASE ) 50 MCG/ACT nasal spray Place 2 sprays into both nostrils daily. (Patient not taking: Reported on 11/22/2024) 16 g 11 Not Taking   sildenafil  (VIAGRA ) 100 MG tablet Take 1 tablet (100 mg total) by mouth daily as needed for erectile dysfunction. 30 tablet 2    Allergies[1]  Social History   Tobacco Use   Smoking status: Former    Current packs/day: 0.00    Types: Cigarettes    Start date: 09/16/1967    Quit date: 09/15/2002    Years since quitting: 22.2   Smokeless tobacco: Never  Substance Use Topics   Alcohol use: Not Currently    Family History  Problem Relation Age of Onset   Cancer Mother        esophageal   Cancer Sister        colon and breast    Cancer Brother        colon   Cancer Father 33       agressive lung cancer     Review of Systems frequent bouts of golfers elbow otherwise negative review of systems  Objective:  Physical Exam  General: Normal appearance endomorphic body habitus no congenital deformities  Orientation: Normal x 3  Mood pleasant and affect  normal  Gait no noticeable limp varus deformity  Upper extremities are normal  He does have some intermittent bouts of medial epicondylitis and golfers elbow  Left knee  Inspection: Tenderness along the medial joint line with no noticeable effusion Range of motion 3-120 degrees Stability tests were normal Muscle strength normal, extensor mechanism intact Skin envelope normal Pulse normal no edema normal temperature of his extremities No lymphadenopathy Normal sensation Normal reflexes Normal coordination and balance    Vital signs in last 24 hours: Pulse Rate:  [92] 92 (01/07 1417) Resp:  [18] 18 (01/07 1417) BP: (156)/(86) 156/86 (01/07 1417) SpO2:  [97 %] 97 % (01/07 1417) Weight:  [116.4 kg] 116.4 kg (01/07 1417)  Labs:     Latest Ref Rng & Units 11/24/2024    2:18 PM 10/03/2023   11:39 AM 10/01/2017    8:24 AM  CBC  WBC 4.0 - 10.5 K/uL 6.4  5.9  4.7   Hemoglobin 13.0 - 17.0 g/dL 84.4  16.0  15.1   Hematocrit 39.0 - 52.0 % 44.7  47.2  44.0   Platelets 150 - 400 K/uL 183  177  153       Latest Ref Rng & Units 11/24/2024    2:18 PM 10/03/2023   11:39 AM 02/02/2018   10:12 AM  BMP  Glucose 70 - 99 mg/dL 88  68  92   BUN 8 - 23 mg/dL 26  27  21    Creatinine 0.61 - 1.24 mg/dL 9.13  9.18  9.04   BUN/Creat Ratio 10 - 24  33  NOT APPLICABLE   Sodium 135 - 145 mmol/L 139  140  138   Potassium 3.5 - 5.1 mmol/L 3.9  4.4  4.7   Chloride 98 - 111 mmol/L 104  104  105   CO2 22 - 32 mmol/L 26  24  28    Calcium  8.9 - 10.3 mg/dL 9.2  9.0  8.9       Estimated body mass index is 41.42 kg/m as calculated from the following:   Height as of 11/24/24: 5' 6 (1.676 m).   Weight as of 11/24/24: 116.4 kg.   Imaging Review  X-ray report   Chief complaint preop imaging for left total knee 3 views  Images 3 views of the knee  Reading: The knee is in varus alignment is less than 10 degrees the medial compartment is narrowed significantly there is some sclerosis of the  subchondral bone and a small medial osteophyte is noted.  The posterior surfaces have no osteophytes on them   The patella looks to have some medial tilt and mild subluxation     Impression: Grade 3 OA left knee with less than 10 degree varus alignment     Assessment/Plan:  Left total knee arthroplasty  End stage arthritis, left knee   The patient history, physical examination, clinical judgment of the provider and imaging studies are consistent with end stage degenerative joint disease of the left knee(s) and total knee arthroplasty is deemed medically necessary. The treatment options including medical management, injection therapy arthroscopy and arthroplasty were discussed at length. The risks and benefits of total knee arthroplasty were presented and reviewed. The risks due to aseptic loosening, infection, stiffness, patella tracking problems, thromboembolic complications and other imponderables were discussed. The patient acknowledged the explanation, agreed to proceed with the plan and consent was signed. Patient is being admitted for inpatient treatment for surgery, pain control, PT, OT, prophylactic antibiotics, VTE prophylaxis, progressive ambulation and ADL's and discharge planning. The patient is planning to be discharged home with home health services        [1] No Known Allergies

## 2024-11-30 ENCOUNTER — Encounter (HOSPITAL_COMMUNITY): Payer: Self-pay | Admitting: Anesthesiology

## 2024-11-30 ENCOUNTER — Encounter (HOSPITAL_COMMUNITY): Admission: RE | Disposition: A | Payer: Self-pay | Source: Home / Self Care | Attending: Orthopedic Surgery

## 2024-11-30 ENCOUNTER — Encounter (HOSPITAL_COMMUNITY): Payer: Self-pay | Admitting: Orthopedic Surgery

## 2024-11-30 ENCOUNTER — Ambulatory Visit (HOSPITAL_COMMUNITY): Payer: Self-pay | Admitting: Anesthesiology

## 2024-11-30 ENCOUNTER — Other Ambulatory Visit: Payer: Self-pay | Admitting: Orthopedic Surgery

## 2024-11-30 ENCOUNTER — Observation Stay (HOSPITAL_COMMUNITY)
Admission: RE | Admit: 2024-11-30 | Discharge: 2024-12-01 | Disposition: A | Discharge status: Home / Self Care | Attending: Orthopedic Surgery | Admitting: Orthopedic Surgery

## 2024-11-30 ENCOUNTER — Ambulatory Visit (HOSPITAL_COMMUNITY)

## 2024-11-30 ENCOUNTER — Other Ambulatory Visit: Payer: Self-pay

## 2024-11-30 DIAGNOSIS — Z87891 Personal history of nicotine dependence: Secondary | ICD-10-CM | POA: Insufficient documentation

## 2024-11-30 DIAGNOSIS — M1712 Unilateral primary osteoarthritis, left knee: Principal | ICD-10-CM | POA: Insufficient documentation

## 2024-11-30 DIAGNOSIS — Z79899 Other long term (current) drug therapy: Secondary | ICD-10-CM | POA: Diagnosis not present

## 2024-11-30 DIAGNOSIS — G8918 Other acute postprocedural pain: Secondary | ICD-10-CM

## 2024-11-30 DIAGNOSIS — M1710 Unilateral primary osteoarthritis, unspecified knee: Principal | ICD-10-CM | POA: Diagnosis present

## 2024-11-30 DIAGNOSIS — I10 Essential (primary) hypertension: Secondary | ICD-10-CM | POA: Diagnosis not present

## 2024-11-30 DIAGNOSIS — Z299 Encounter for prophylactic measures, unspecified: Secondary | ICD-10-CM

## 2024-11-30 HISTORY — PX: TOTAL KNEE ARTHROPLASTY: SHX125

## 2024-11-30 LAB — ABO/RH: ABO/RH(D): O POS

## 2024-11-30 MED ORDER — TRAMADOL HCL 50 MG PO TABS: 50.0000 mg | ORAL_TABLET | Freq: Four times a day (QID) | ORAL | 0 refills | Status: AC

## 2024-11-30 MED ORDER — GABAPENTIN 100 MG PO CAPS
100.0000 mg | ORAL_CAPSULE | Freq: Once | ORAL | Status: AC
  Administered 2024-11-30: 100 mg via ORAL
  Filled 2024-11-30: qty 1

## 2024-11-30 MED ORDER — METHOCARBAMOL 500 MG PO TABS
500.0000 mg | ORAL_TABLET | Freq: Three times a day (TID) | ORAL | Status: DC
  Administered 2024-11-30 – 2024-12-01 (×3): 500 mg via ORAL
  Filled 2024-11-30 (×6): qty 1

## 2024-11-30 MED ORDER — PANTOPRAZOLE SODIUM 40 MG PO TBEC
40.0000 mg | DELAYED_RELEASE_TABLET | Freq: Every day | ORAL | Status: DC
  Administered 2024-12-01: 40 mg via ORAL
  Filled 2024-11-30: qty 1

## 2024-11-30 MED ORDER — ONDANSETRON HCL 4 MG/2ML IJ SOLN
INTRAMUSCULAR | Status: AC
  Filled 2024-11-30: qty 2

## 2024-11-30 MED ORDER — BUPIVACAINE-MELOXICAM ER 400-12 MG/14ML IJ SOLN
INTRAMUSCULAR | Status: DC | PRN
  Administered 2024-11-30: 400 mg

## 2024-11-30 MED ORDER — OXYCODONE HCL 5 MG PO TABS: 5.0000 mg | ORAL_TABLET | ORAL | Status: DC | PRN

## 2024-11-30 MED ORDER — METOCLOPRAMIDE HCL 10 MG PO TABS: 5.0000 mg | ORAL_TABLET | Freq: Three times a day (TID) | ORAL | Status: DC | PRN

## 2024-11-30 MED ORDER — ENOXAPARIN SODIUM 30 MG/0.3ML IJ SOSY
30.0000 mg | PREFILLED_SYRINGE | Freq: Two times a day (BID) | INTRAMUSCULAR | Status: DC
  Administered 2024-12-01: 30 mg via SUBCUTANEOUS
  Filled 2024-11-30: qty 0.3

## 2024-11-30 MED ORDER — SODIUM CHLORIDE 0.9 % IV SOLN: INTRAVENOUS | Status: DC

## 2024-11-30 MED ORDER — CALCIUM CARBONATE ANTACID 500 MG PO CHEW
CHEWABLE_TABLET | Freq: Every day | ORAL | Status: DC
  Filled 2024-11-30 (×2): qty 1

## 2024-11-30 MED ORDER — DEXMEDETOMIDINE HCL IN NACL 80 MCG/20ML IV SOLN
INTRAVENOUS | Status: AC
  Filled 2024-11-30: qty 20

## 2024-11-30 MED ORDER — IRBESARTAN 150 MG PO TABS: 150.0000 mg | ORAL_TABLET | Freq: Every day | ORAL | Status: DC

## 2024-11-30 MED ORDER — CEFAZOLIN SODIUM-DEXTROSE 2-4 GM/100ML-% IV SOLN
2.0000 g | Freq: Four times a day (QID) | INTRAVENOUS | Status: AC
  Administered 2024-11-30: 2 g via INTRAVENOUS
  Filled 2024-11-30 (×2): qty 100

## 2024-11-30 MED ORDER — TRAMADOL HCL 50 MG PO TABS
50.0000 mg | ORAL_TABLET | Freq: Four times a day (QID) | ORAL | Status: DC
  Administered 2024-11-30 – 2024-12-01 (×4): 50 mg via ORAL
  Filled 2024-11-30 (×4): qty 1

## 2024-11-30 MED ORDER — TRANEXAMIC ACID-NACL 1000-0.7 MG/100ML-% IV SOLN
1000.0000 mg | Freq: Once | INTRAVENOUS | Status: AC
  Administered 2024-11-30: 1000 mg via INTRAVENOUS
  Filled 2024-11-30 (×2): qty 100

## 2024-11-30 MED ORDER — ADULT MULTIVITAMIN W/MINERALS CH
1.0000 | ORAL_TABLET | Freq: Every day | ORAL | Status: DC
  Administered 2024-11-30 – 2024-12-01 (×2): 1 via ORAL
  Filled 2024-11-30 (×2): qty 1

## 2024-11-30 MED ORDER — PHENOL 1.4 % MT LIQD: 1.0000 | OROMUCOSAL | Status: DC | PRN

## 2024-11-30 MED ORDER — DOCUSATE SODIUM 100 MG PO CAPS
100.0000 mg | ORAL_CAPSULE | Freq: Two times a day (BID) | ORAL | Status: DC
  Filled 2024-11-30 (×4): qty 1

## 2024-11-30 MED ORDER — CEFAZOLIN SODIUM-DEXTROSE 2-4 GM/100ML-% IV SOLN
2.0000 g | INTRAVENOUS | Status: AC
  Administered 2024-11-30: 2 g via INTRAVENOUS
  Filled 2024-11-30: qty 100

## 2024-11-30 MED ORDER — ASPIRIN 81 MG PO TBEC: 81.0000 mg | DELAYED_RELEASE_TABLET | Freq: Two times a day (BID) | ORAL | 12 refills | Status: AC

## 2024-11-30 MED ORDER — SODIUM CHLORIDE 0.9% IV SOLUTION: Freq: Once | INTRAVENOUS | Status: DC

## 2024-11-30 MED ORDER — CHLORHEXIDINE GLUCONATE 0.12 % MT SOLN
15.0000 mL | Freq: Once | OROMUCOSAL | Status: AC
  Administered 2024-11-30: 15 mL via OROMUCOSAL

## 2024-11-30 MED ORDER — DEXAMETHASONE SODIUM PHOSPHATE 4 MG/ML IJ SOLN: 8.0000 mg | Freq: Once | INTRAMUSCULAR | Status: AC

## 2024-11-30 MED ORDER — MIDAZOLAM HCL 5 MG/5ML IJ SOLN
INTRAMUSCULAR | Status: DC | PRN
  Administered 2024-11-30: 2 mg via INTRAVENOUS

## 2024-11-30 MED ORDER — CELECOXIB 100 MG PO CAPS
200.0000 mg | ORAL_CAPSULE | Freq: Two times a day (BID) | ORAL | Status: DC
  Administered 2024-11-30 – 2024-12-01 (×3): 200 mg via ORAL
  Filled 2024-11-30 (×3): qty 2

## 2024-11-30 MED ORDER — OXYCODONE HCL 5 MG PO TABS
5.0000 mg | ORAL_TABLET | ORAL | Status: DC | PRN
  Administered 2024-11-30 – 2024-12-01 (×2): 5 mg via ORAL
  Filled 2024-11-30 (×2): qty 1

## 2024-11-30 MED ORDER — AZELASTINE HCL 0.1 % NA SOLN: 2.0000 | Freq: Two times a day (BID) | NASAL | Status: DC | PRN

## 2024-11-30 MED ORDER — CELECOXIB 200 MG PO CAPS: 200.0000 mg | ORAL_CAPSULE | Freq: Two times a day (BID) | ORAL | 0 refills | Status: AC

## 2024-11-30 MED ORDER — MIDAZOLAM HCL 2 MG/2ML IJ SOLN
INTRAMUSCULAR | Status: AC
  Filled 2024-11-30: qty 2

## 2024-11-30 MED ORDER — BUPIVACAINE-MELOXICAM ER 400-12 MG/14ML IJ SOLN
400.0000 mg | INTRAMUSCULAR | Status: AC
  Filled 2024-11-30: qty 1

## 2024-11-30 MED ORDER — DOCUSATE SODIUM 100 MG PO CAPS: 100.0000 mg | ORAL_CAPSULE | Freq: Two times a day (BID) | ORAL | 0 refills | Status: AC

## 2024-11-30 MED ORDER — PHENYLEPHRINE HCL-NACL 20-0.9 MG/250ML-% IV SOLN
INTRAVENOUS | Status: DC | PRN
  Administered 2024-11-30 (×3): 80 ug via INTRAVENOUS

## 2024-11-30 MED ORDER — 0.9 % SODIUM CHLORIDE (POUR BTL) OPTIME
TOPICAL | Status: DC | PRN
  Administered 2024-11-30: 1000 mL

## 2024-11-30 MED ORDER — ACETAMINOPHEN 500 MG PO TABS
1000.0000 mg | ORAL_TABLET | Freq: Four times a day (QID) | ORAL | Status: DC
  Administered 2024-11-30 – 2024-12-01 (×4): 1000 mg via ORAL
  Filled 2024-11-30 (×5): qty 2

## 2024-11-30 MED ORDER — IRBESARTAN 150 MG PO TABS
150.0000 mg | ORAL_TABLET | Freq: Every day | ORAL | Status: DC
  Administered 2024-12-01: 150 mg via ORAL
  Filled 2024-11-30: qty 1

## 2024-11-30 MED ORDER — METHOCARBAMOL 500 MG PO TABS: 500.0000 mg | ORAL_TABLET | Freq: Three times a day (TID) | ORAL | 1 refills | Status: AC

## 2024-11-30 MED ORDER — MENTHOL 3 MG MT LOZG: 1.0000 | LOZENGE | OROMUCOSAL | Status: DC | PRN

## 2024-11-30 MED ORDER — POLYETHYLENE GLYCOL 3350 17 G PO PACK
17.0000 g | PACK | Freq: Every day | ORAL | Status: DC
  Administered 2024-11-30 – 2024-12-01 (×2): 17 g via ORAL
  Filled 2024-11-30 (×2): qty 1

## 2024-11-30 MED ORDER — PHENYLEPHRINE 80 MCG/ML (10ML) SYRINGE FOR IV PUSH (FOR BLOOD PRESSURE SUPPORT)
PREFILLED_SYRINGE | INTRAVENOUS | Status: AC
  Filled 2024-11-30: qty 10

## 2024-11-30 MED ORDER — PROPOFOL 500 MG/50ML IV EMUL
INTRAVENOUS | Status: AC
  Filled 2024-11-30: qty 50

## 2024-11-30 MED ORDER — PROPOFOL 10 MG/ML IV BOLUS
INTRAVENOUS | Status: AC
  Filled 2024-11-30: qty 20

## 2024-11-30 MED ORDER — SODIUM CHLORIDE 0.9 % IR SOLN
Status: DC | PRN
  Administered 2024-11-30: 3000 mL

## 2024-11-30 MED ORDER — DEXAMETHASONE SOD PHOSPHATE PF 10 MG/ML IJ SOLN
INTRAMUSCULAR | Status: AC
  Filled 2024-11-30: qty 1

## 2024-11-30 MED ORDER — LACTATED RINGERS IV SOLN: INTRAVENOUS | Status: DC

## 2024-11-30 MED ORDER — DEXMEDETOMIDINE HCL IN NACL 80 MCG/20ML IV SOLN
INTRAVENOUS | Status: DC | PRN
  Administered 2024-11-30 (×2): 8 ug via INTRAVENOUS

## 2024-11-30 MED ORDER — LORATADINE 10 MG PO TABS
10.0000 mg | ORAL_TABLET | Freq: Every day | ORAL | Status: DC
  Administered 2024-11-30 – 2024-12-01 (×2): 10 mg via ORAL
  Filled 2024-11-30 (×2): qty 1

## 2024-11-30 MED ORDER — OXYCODONE HCL 5 MG/5ML PO SOLN: 5.0000 mg | Freq: Once | ORAL | Status: DC | PRN

## 2024-11-30 MED ORDER — METOCLOPRAMIDE HCL 5 MG/ML IJ SOLN: 5.0000 mg | Freq: Three times a day (TID) | INTRAMUSCULAR | Status: DC | PRN

## 2024-11-30 MED ORDER — ONDANSETRON HCL 4 MG/2ML IJ SOLN: 4.0000 mg | Freq: Once | INTRAMUSCULAR | Status: DC | PRN

## 2024-11-30 MED ORDER — STERILE WATER FOR IRRIGATION IR SOLN
Status: DC | PRN
  Administered 2024-11-30: 2000 mL

## 2024-11-30 MED ORDER — FENTANYL CITRATE (PF) 100 MCG/2ML IJ SOLN
INTRAMUSCULAR | Status: DC | PRN
  Administered 2024-11-30: 50 ug via INTRAVENOUS
  Administered 2024-11-30 (×2): 25 ug via INTRAVENOUS

## 2024-11-30 MED ORDER — PROPOFOL 500 MG/50ML IV EMUL
INTRAVENOUS | Status: DC | PRN
  Administered 2024-11-30: 75 ug/kg/min via INTRAVENOUS
  Administered 2024-11-30: 80 mg via INTRAVENOUS

## 2024-11-30 MED ORDER — ACETAMINOPHEN 500 MG PO TABS: 500.0000 mg | ORAL_TABLET | Freq: Four times a day (QID) | ORAL | 0 refills | Status: AC | PRN

## 2024-11-30 MED ORDER — ORAL CARE MOUTH RINSE: 15.0000 mL | Freq: Once | OROMUCOSAL | Status: AC

## 2024-11-30 MED ORDER — TRAMADOL HCL 50 MG PO TABS
50.0000 mg | ORAL_TABLET | Freq: Four times a day (QID) | ORAL | Status: DC
  Administered 2024-11-30: 50 mg via ORAL
  Filled 2024-11-30: qty 1

## 2024-11-30 MED ORDER — FENTANYL CITRATE (PF) 100 MCG/2ML IJ SOLN
INTRAMUSCULAR | Status: AC
  Filled 2024-11-30: qty 2

## 2024-11-30 MED ORDER — DEXAMETHASONE SODIUM PHOSPHATE 4 MG/ML IJ SOLN
INTRAMUSCULAR | Status: DC | PRN
  Administered 2024-11-30: 4 mg via INTRAVENOUS

## 2024-11-30 MED ORDER — ONDANSETRON HCL 4 MG PO TABS: 4.0000 mg | ORAL_TABLET | Freq: Four times a day (QID) | ORAL | Status: DC | PRN

## 2024-11-30 MED ORDER — TRANEXAMIC ACID-NACL 1000-0.7 MG/100ML-% IV SOLN
1000.0000 mg | INTRAVENOUS | Status: AC
  Administered 2024-11-30: 1000 mg via INTRAVENOUS
  Filled 2024-11-30: qty 100

## 2024-11-30 MED ORDER — OXYCODONE HCL 5 MG PO TABS: 5.0000 mg | ORAL_TABLET | Freq: Once | ORAL | Status: DC | PRN

## 2024-11-30 MED ORDER — ONDANSETRON HCL 4 MG/2ML IJ SOLN: 4.0000 mg | Freq: Four times a day (QID) | INTRAMUSCULAR | Status: DC | PRN

## 2024-11-30 MED ORDER — KETOROLAC TROMETHAMINE 30 MG/ML IJ SOLN
15.0000 mg | Freq: Once | INTRAMUSCULAR | Status: AC
  Administered 2024-11-30: 15 mg via INTRAVENOUS
  Filled 2024-11-30: qty 1

## 2024-11-30 MED ORDER — ACETAMINOPHEN 500 MG PO TABS: 500.0000 mg | ORAL_TABLET | Freq: Four times a day (QID) | ORAL | Status: DC

## 2024-11-30 MED ORDER — DOCUSATE SODIUM 100 MG PO CAPS
100.0000 mg | ORAL_CAPSULE | Freq: Two times a day (BID) | ORAL | Status: DC
  Administered 2024-11-30 – 2024-12-01 (×3): 100 mg via ORAL
  Filled 2024-11-30 (×2): qty 1

## 2024-11-30 MED ORDER — OXYCODONE HCL 5 MG PO TABS: 5.0000 mg | ORAL_TABLET | ORAL | 0 refills | Status: AC | PRN

## 2024-11-30 MED ORDER — VITAMIN B-12 1000 MCG PO TABS
1000.0000 ug | ORAL_TABLET | Freq: Every day | ORAL | Status: DC
  Administered 2024-11-30 – 2024-12-01 (×2): 1000 ug via ORAL
  Filled 2024-11-30 (×4): qty 1

## 2024-11-30 MED ORDER — POVIDONE-IODINE 10 % EX SWAB
2.0000 | Freq: Once | CUTANEOUS | Status: AC
  Administered 2024-11-30: 2 via TOPICAL

## 2024-11-30 MED ORDER — BUPIVACAINE HCL (PF) 0.5 % IJ SOLN
INTRAMUSCULAR | Status: DC | PRN
  Administered 2024-11-30 (×3): 5 mL
  Administered 2024-11-30: 3 mL

## 2024-11-30 MED ORDER — ONDANSETRON HCL 4 MG/2ML IJ SOLN
INTRAMUSCULAR | Status: DC | PRN
  Administered 2024-11-30: 4 mg via INTRAVENOUS

## 2024-11-30 MED ORDER — PROPOFOL 500 MG/50ML IV EMUL
INTRAVENOUS | Status: AC
  Filled 2024-11-30: qty 100

## 2024-11-30 MED ORDER — VITAMIN D 25 MCG (1000 UNIT) PO TABS
1000.0000 [IU] | ORAL_TABLET | Freq: Every day | ORAL | Status: DC
  Administered 2024-11-30 – 2024-12-01 (×2): 1000 [IU] via ORAL
  Filled 2024-11-30 (×2): qty 1

## 2024-11-30 MED ORDER — FENTANYL CITRATE (PF) 50 MCG/ML IJ SOSY: 25.0000 ug | PREFILLED_SYRINGE | INTRAMUSCULAR | Status: DC | PRN

## 2024-11-30 NOTE — Interval H&P Note (Signed)
 History and Physical Interval Note:  11/30/2024 7:20 AM  Peter Mora  has presented today for surgery, with the diagnosis of Osteoarthritis left knee.  The various methods of treatment have been discussed with the patient and family. After consideration of risks, benefits and other options for treatment, the patient has consented to  Procedures: ARTHROPLASTY, KNEE, TOTAL (Left) as a surgical intervention.  The patient's history has been reviewed, patient examined, no change in status, stable for surgery.  I have reviewed the patient's chart and labs.  Questions were answered to the patient's satisfaction.     Taft Minerva

## 2024-11-30 NOTE — Anesthesia Procedure Notes (Signed)
 Anesthesia Regional Block: Adductor canal block   Pre-Anesthetic Checklist: , timeout performed,  Correct Patient, Correct Site, Correct Laterality,  Correct Procedure, Correct Position, site marked,  Risks and benefits discussed,  Surgical consent,  Pre-op evaluation,  At surgeon's request and post-op pain management  Laterality: Left  Prep: chloraprep       Needles:  Injection technique: Single-shot  Needle Type: Stimiplex     Needle Length: 15cm  Needle Gauge: 20     Additional Needles:   Procedures:,,,, ultrasound used (permanent image in chart),,    Narrative:  Start time: 11/30/2024 9:52 AM End time: 11/30/2024 9:55 AM Injection made incrementally with aspirations every 5 mL.  Performed by: With CRNAs  Anesthesiologist: Kendell Yvonna PARAS, MD CRNA: Pheobe Adine CROME, CRNA

## 2024-11-30 NOTE — Evaluation (Signed)
 Physical Therapy Evaluation Patient Details Name: Peter Mora MRN: [id number] DOB: 1946/05/31 Today's Date: 11/30/2024   LEFT KNEE ROM: 0-88 degrees AMBULATION DISTANCE: 150 feet    History of Present Illness  Peter Mora, 79 y.o. male, has a history of pain and functional disability in the left knee due to arthritis and has failed non-surgical conservative treatments for greater than 12 weeks to includeNSAID's and/or analgesics, corticosteriod injections, viscosupplementation injections, use of assistive devices, and activity modification.  Onset of symptoms was gradual, starting 8 years ago with gradually worsening course since that time. The patient noted prior procedures on the knee to include  arthroscopy on the left knee(s).  Patient currently rates pain in the left knee(s) AS INCREASING WITH activity. Patient has worsening of pain with activity and weight bearing and pain that interferes with activities of daily living.  Patient has evidence of subchondral cysts, subchondral sclerosis, periarticular osteophytes, and joint space narrowing by imaging studies.  There is no active infection.   Clinical Impression  Patient agreeable to PT evaluation. Patient has a family friend present during session, [name], who will stay with patient at least 1 week after discharge from hospital. Patient reports at baseline, he is independent with ambulation in community and with ADLs/iADLs. Patient is given and demonstrates understanding of a few supine and seated LE exercises. Reports understanding of everyday compliance for post-op recovery. This date, patient required min assist during bed mobility, and CGA during transfers and ambulation with RW. AROM: 0-88. Patient limited most due to pain. Patient returns to bed at end of session, call button in reach, all needs met, with friend and nursing staff present. Patient will benefit from continued skilled physical therapy acutely and in recommended  venue in order to address current deficits and return to PLOF.        If plan is discharge home, recommend the following: A little help with walking and/or transfers;Assistance with cooking/housework;Assist for transportation;Help with stairs or ramp for entrance   Can travel by private vehicle        Equipment Recommendations None recommended by PT  Recommendations for Other Services       Functional Status Assessment Patient has had a recent decline in their functional status and demonstrates the ability to make significant improvements in function in a reasonable and predictable amount of time.     Precautions / Restrictions Precautions Precautions: Fall Recall of Precautions/Restrictions: Intact Restrictions Weight Bearing Restrictions Per Provider Order: Yes Other Position/Activity Restrictions: WBAT LLE      Mobility  Bed Mobility Overal bed mobility: Needs Assistance Bed Mobility: Supine to Sit, Sit to Supine     Supine to sit: Min assist Sit to supine: Min assist   General bed mobility comments: assisted with guarding/supporting LLE during transfer and trunk elevation at end of transfer via pull to sit, overall slow labored movement    Transfers Overall transfer level: Needs assistance Equipment used: Rolling walker (2 wheels) Transfers: Sit to/from Stand Sit to Stand: Contact guard assist           General transfer comment: CGA during STS from bed for safety, pt reports mild tranisent dizziness that recovers with time, used RW    Ambulation/Gait Ambulation/Gait assistance: Contact guard assist Gait Distance (Feet): 150 Feet Assistive device: Rolling walker (2 wheels) Gait Pattern/deviations: Step-to pattern, Decreased step length - right, Decreased stance time - left, Decreased stride length, Decreased weight shift to left Gait velocity: dec     General Gait  Details: Pt ambulates into hall with RW and CGA for safety with the above deviations,  decreased gait speed, pt requires 2-3 standing rest breaks due to increased soreness in L knee, reports no dizziness lightheadedness or SOB throughout, no overt LOB or unsteadiness to note  Stairs            Wheelchair Mobility     Tilt Bed    Modified Rankin (Stroke Patients Only)       Balance Overall balance assessment: Mild deficits observed, not formally tested                                           Pertinent Vitals/Pain Pain Assessment Pain Assessment: 0-10 Pain Score: 4  Pain Location: 4/10 at start of session, 7/10 at end of session Pain Descriptors / Indicators: Discomfort, Sore Pain Intervention(s): Limited activity within patient's tolerance, Repositioned, Patient requesting pain meds-RN notified, Monitored during session    Home Living Family/patient expects to be discharged to:: Private residence Living Arrangements: Alone Available Help at Discharge: Friend(s);Available 24 hours/day Type of Home: House Home Access: Level entry       Home Layout: Other (Comment) (Split level home but reports he can manuever around home without having to navigate steps) Home Equipment: Rolling Walker (2 wheels);BSC/3in1;Grab bars - tub/shower Additional Comments: Family friend, [name], will be staying with pt at his home for at least one week. plans to stay until home health PT clears patient    Prior Function Prior Level of Function : Independent/Modified Independent;Driving             Mobility Comments: Independent community ambulator, drives, no falls, does wood working ADLs Comments: Independent     Extremity/Trunk Assessment   Upper Extremity Assessment Upper Extremity Assessment: Overall WFL for tasks assessed    Lower Extremity Assessment Lower Extremity Assessment: LLE deficits/detail LLE Deficits / Details: As expected post-op stiffness and pain limits gross motor. RLE WFL. LLE: Unable to fully assess due to pain LLE  Coordination: decreased gross motor    Cervical / Trunk Assessment Cervical / Trunk Assessment: Normal  Communication   Communication Communication: No apparent difficulties    Cognition Arousal: Alert Behavior During Therapy: WFL for tasks assessed/performed   PT - Cognitive impairments: No apparent impairments                         Following commands: Intact       Cueing Cueing Techniques: Verbal cues, Visual cues     General Comments      Exercises General Exercises - Lower Extremity Ankle Circles/Pumps: AROM, Strengthening, Left, 5 reps, Supine Quad Sets: AROM, Strengthening, Left, 5 reps, Seated Gluteal Sets: AROM, Strengthening, Left, 5 reps, Seated Short Arc Quad: AROM, Strengthening, Left, 5 reps, Supine Heel Slides: AROM, Strengthening, Left, 5 reps, Supine   Assessment/Plan    PT Assessment Patient needs continued PT services;All further PT needs can be met in the next venue of care  PT Problem List Decreased strength;Decreased range of motion;Decreased activity tolerance;Decreased balance;Decreased mobility;Pain       PT Treatment Interventions DME instruction;Balance training;Gait training;Therapeutic activities;Therapeutic exercise;Stair training;Functional mobility training;Patient/family education    PT Goals (Current goals can be found in the Care Plan section)  Acute Rehab PT Goals Patient Stated Goal: Return home with HHPT PT Goal Formulation: With patient Time For Goal  Achievement: 12/03/24 Potential to Achieve Goals: Good    Frequency Min 5X/week     Co-evaluation               AM-PAC PT 6 Clicks Mobility  Outcome Measure Help needed turning from your back to your side while in a flat bed without using bedrails?: None Help needed moving from lying on your back to sitting on the side of a flat bed without using bedrails?: A Little Help needed moving to and from a bed to a chair (including a wheelchair)?: A Little Help  needed standing up from a chair using your arms (e.g., wheelchair or bedside chair)?: A Little Help needed to walk in hospital room?: A Little Help needed climbing 3-5 steps with a railing? : A Lot 6 Click Score: 18    End of Session Equipment Utilized During Treatment: Gait belt Activity Tolerance: Patient tolerated treatment well Patient left: in bed;with call bell/phone within reach;with nursing/sitter in room;with family/visitor present Nurse Communication: Mobility status PT Visit Diagnosis: Difficulty in walking, not elsewhere classified (R26.2);Other abnormalities of gait and mobility (R26.89);Muscle weakness (generalized) (M62.81);Pain Pain - Right/Left: Left Pain - part of body: Knee    Time: 1447-1530 PT Time Calculation (min) (ACUTE ONLY): 43 min   Charges:   PT Evaluation $PT Eval Moderate Complexity: 1 Mod   PT General Charges $$ ACUTE PT VISIT: 1 Visit         3:59 PM, 11/30/2024 Maddie Brazier Powell-Butler, PT, DPT [place] with [facility]

## 2024-11-30 NOTE — Progress Notes (Signed)
 Patient changed into cloth gown, repositioned in stretcher, tolerating po fluids alert oriented

## 2024-11-30 NOTE — Anesthesia Preprocedure Evaluation (Addendum)
"                                    Anesthesia Evaluation  Patient identified by MRN, date of birth, ID band Patient awake    Reviewed: Allergy  & Precautions, NPO status , Patient's Chart, lab work & pertinent test results  Airway Mallampati: II  TM Distance: >3 FB Neck ROM: Full    Dental no notable dental hx.    Pulmonary Current Smoker and Patient abstained from smoking., former smoker   Pulmonary exam normal        Cardiovascular hypertension, Normal cardiovascular exam     Neuro/Psych  PSYCHIATRIC DISORDERS      negative neurological ROS     GI/Hepatic Neg liver ROS,GERD  ,,  Endo/Other  negative endocrine ROS    Renal/GU negative Renal ROS  negative genitourinary   Musculoskeletal  (+) Arthritis ,    Abdominal Normal abdominal exam  (+)   Peds negative pediatric ROS (+)  Hematology negative hematology ROS (+)   Anesthesia Other Findings   Reproductive/Obstetrics negative OB ROS                              Anesthesia Physical Anesthesia Plan  ASA: 3  Anesthesia Plan: Spinal and Regional   Post-op Pain Management: Dilaudid  IV and Regional block*   Induction: Intravenous  PONV Risk Score and Plan: 1 and TIVA and Propofol  infusion  Airway Management Planned: Natural Airway and Nasal Cannula  Additional Equipment:   Intra-op Plan:   Post-operative Plan:   Informed Consent: I have reviewed the patients History and Physical, chart, labs and discussed the procedure including the risks, benefits and alternatives for the proposed anesthesia with the patient or authorized representative who has indicated his/her understanding and acceptance.     Dental advisory given  Plan Discussed with: Anesthesiologist and Surgeon  Anesthesia Plan Comments:          Anesthesia Quick Evaluation  "

## 2024-11-30 NOTE — Plan of Care (Signed)
" °  Problem: Education: Goal: Knowledge of the prescribed therapeutic regimen will improve Outcome: Progressing   Problem: Bowel/Gastric: Goal: Gastrointestinal status for postoperative course will improve Outcome: Progressing   Problem: Cardiac: Goal: Ability to maintain an adequate cardiac output Outcome: Progressing Goal: Will show no evidence of cardiac arrhythmias Outcome: Progressing   Problem: Nutritional: Goal: Will attain and maintain optimal nutritional status Outcome: Progressing   Problem: Neurological: Goal: Will regain or maintain usual level of consciousness Outcome: Progressing   Problem: Clinical Measurements: Goal: Ability to maintain clinical measurements within normal limits Outcome: Progressing Goal: Postoperative complications will be avoided or minimized Outcome: Progressing   Problem: Respiratory: Goal: Will regain and/or maintain adequate ventilation Outcome: Progressing Goal: Respiratory status will improve Outcome: Progressing   Problem: Skin Integrity: Goal: Demonstrates signs of wound healing without infection Outcome: Progressing   Problem: Urinary Elimination: Goal: Will remain free from infection Outcome: Progressing Goal: Ability to achieve and maintain adequate urine output Outcome: Progressing   Problem: Education: Goal: Knowledge of General Education information will improve Description: Including pain rating scale, medication(s)/side effects and non-pharmacologic comfort measures Outcome: Progressing   Problem: Health Behavior/Discharge Planning: Goal: Ability to manage health-related needs will improve Outcome: Progressing   Problem: Clinical Measurements: Goal: Ability to maintain clinical measurements within normal limits will improve Outcome: Progressing Goal: Will remain free from infection Outcome: Progressing Goal: Diagnostic test results will improve Outcome: Progressing Goal: Respiratory complications will  improve Outcome: Progressing Goal: Cardiovascular complication will be avoided Outcome: Progressing   Problem: Activity: Goal: Risk for activity intolerance will decrease Outcome: Progressing   Problem: Nutrition: Goal: Adequate nutrition will be maintained Outcome: Progressing   Problem: Coping: Goal: Level of anxiety will decrease Outcome: Progressing   Problem: Elimination: Goal: Will not experience complications related to bowel motility Outcome: Progressing Goal: Will not experience complications related to urinary retention Outcome: Progressing   Problem: Pain Managment: Goal: General experience of comfort will improve and/or be controlled Outcome: Progressing   Problem: Safety: Goal: Ability to remain free from injury will improve Outcome: Progressing   Problem: Skin Integrity: Goal: Risk for impaired skin integrity will decrease Outcome: Progressing   Problem: Education: Goal: Knowledge of the prescribed therapeutic regimen will improve Outcome: Progressing Goal: Individualized Educational Video(s) Outcome: Progressing   Problem: Activity: Goal: Ability to avoid complications of mobility impairment will improve Outcome: Progressing Goal: Range of joint motion will improve Outcome: Progressing   Problem: Clinical Measurements: Goal: Postoperative complications will be avoided or minimized Outcome: Progressing   Problem: Pain Management: Goal: Pain level will decrease with appropriate interventions Outcome: Progressing   Problem: Skin Integrity: Goal: Will show signs of wound healing Outcome: Progressing   "

## 2024-11-30 NOTE — Transfer of Care (Signed)
 Immediate Anesthesia Transfer of Care Note  Patient: Peter Mora  Procedure(s) Performed: ARTHROPLASTY, KNEE, TOTAL (Left: Knee)  Patient Location: PACU  Anesthesia Type:Regional and Spinal  Level of Consciousness: awake, alert , oriented, and patient cooperative  Airway & Oxygen Therapy: Patient Spontanous Breathing and Patient connected to nasal cannula oxygen  Post-op Assessment: Report given to RN and Post -op Vital signs reviewed and stable  Post vital signs: Reviewed and stable  Last Vitals:  Vitals Value Taken Time  BP 96/72 11/30/24 09:51  Temp 36.9 C 11/30/24 09:47  Pulse 67 11/30/24 09:57  Resp 14 11/30/24 09:57  SpO2 97 % 11/30/24 09:57  Vitals shown include unfiled device data.  Last Pain:  Vitals:   11/30/24 0631  TempSrc: Oral  PainSc: 0-No pain      Patients Stated Pain Goal: 4 (11/30/24 0631)  Complications: No notable events documented.

## 2024-11-30 NOTE — Op Note (Signed)
 11/30/2024  7:30 AM  9:46 AM  PATIENT:  Peter Mora  79 y.o. male  PRE-OPERATIVE DIAGNOSIS:  Osteoarthritis left knee  POST-OPERATIVE DIAGNOSIS:  Osteoarthritis left knee  PROCEDURE:  Procedures: ARTHROPLASTY, KNEE, TOTAL (Left)  SURGEON:  Surgeons and Role:    * Margrette Taft BRAVO, MD - Primary  PHYSICIAN ASSISTANT:   ASSISTANTS: Montie Eke and Earnestine Sharps  ANESTHESIA:   Spinal plus IV sedation with postop saphenous nerve block in PACU  EBL:  minimal   BLOOD ADMINISTERED:none  DRAINS: none   LOCAL MEDICATIONS USED:  OTHER Zynrelef   SPECIMEN:  No Specimen  DISPOSITION OF SPECIMEN:  N/A  COUNTS:  YES  TOURNIQUET:   Total Tourniquet Time Documented: Thigh (Left) - 93 minutes Total: Thigh (Left) - 93 minutes   DICTATION: .Nechama Dictation  PLAN OF CARE: Admit for overnight observation  PATIENT DISPOSITION:  PACU - hemodynamically stable.   Delay start of Pharmacological VTE agent (>24hrs) due to surgical blood loss or risk of bleeding: yes   Dictation for total knee replacement  Orthopaedic Surgery Operative Note (CSN: [id number])  Peter Mora  [birth year] Date of Surgery: 11/30/2024  TXA was administered prior to surgery Operative Finding The medial compartment grade 4 disease medial patellofemoral joint grade 4 disease medial tibia grade 4 disease  The lateral compartment was normal with intact lateral meniscus ACL and PCL present and intact  Medial meniscus partially deficient   Bone cuts Distal femur - 9  Proximal tibia - 10 (lateral side)  Patella -initial thickness was 23 final thickness was 13   Implants: Implant Name Type Inv. Item Serial No. Manufacturer Lot No. LRB No. Used Action  CEMENT HV SMART SET - [id number] Cement CEMENT HV SMART SET  [employer] [id number] Left 1 Implanted  CEMENT HV SMART SET - [id number] Cement CEMENT HV SMART SET  [employer] [id number] Left 1 Implanted  ATTUNE MED DOME PAT 38  KNEE - [id number] Knees ATTUNE MED DOME PAT 38 KNEE  [employer] [id number] Left 1 Implanted  ATTUNE PS FEM LT SZ 6 CEM KNEE - [id number] Femur ATTUNE PS FEM LT SZ 6 CEM KNEE  [employer] [id number] Left 1 Implanted  INSERT TIB ATTUNE FB SZ6X6 - [id number] Insert INSERT TIB ATTUNE FB SZ6X6  [employer] [id number] Left 1 Implanted  BASEPLATE TIB CEM ATTUNE SZ7 - [id number] Knees BASEPLATE TIB CEM ATTUNE SZ7  [employer] [id number] Left 1 Implanted     Indications for Surgery:   Disabling pain of the left knee which did not improve despite nonoperative measures. The benefits and risks of operative and nonoperative management were discussed prior to surgery with patient/guardian(s) and informed consent form was completed.  While all risks cannot be anticipated, specific risks including infection, need for additional surgery, stiffness, postop pain, infection, implant removal, loosening, infection requiring amputation, deep vein thrombosis, pulmonary embolus were discussed.   Procedure:    Details of surgery: Preop The patient identified in the preop area by 2 approved identification markers The surgical site was confirmed as left knee and marked The chart was reviewed including implants and surgical plan The patient was taken to the operating room  In the operating room the patient was given a spinal anesthetic and a Foley catheter was inserted.  He was in the supine position with the proximal tourniquet on the left thigh  2 g of Ancef  was given IV   Timeout was executed confirming the patient's name, surgical site, antibiotic administration, x-rays available, and  implants available.  The operative limb,  was exsanguinated with a six-inch Esmarch and the tourniquet was inflated to 280 mmHg.  A straight midline incision was made over the left KNEE and taken down to the extensor mechanism. A medial arthrotomy was performed. The patella was everted and the  patellofemoral soft tissue was released, along with the patellar fat pad.  The anterior cruciate ligament and PCL were resected. The anterior horns of the lateral and medial meniscus were resected. The medial soft tissue sleeve was elevated to the mid coronal plane.   A three-eighths inch drill bit was used to enter the femoral canal which was decompressed with suction and irrigation until clear.  The distal femoral cutting guide was set for 9 mm distal resection,  5valgus alignment, for a left knee. The distal femur was resected and checked for flatness and thickness and the measured resection was 9 mm The attune sizing femoral guide was placed and the femur was preliminarily sized to a size 6.    The external alignment guide for the tibial resection was then applied to the distal and proximal tibia and set for 5 degree posterior slope along with 10 MM resection reference from the lateral side.  Rotational alignment was set using the malleolus, the tibial tubercle and the tibial spines and PCL. The proximal tibia was resected along with  residual menisci. The tibia was sized using a base plate to a size 7.    The extension gap was checked.  Using a 5 and 6 spacer block with the 6 spacer block balancing the knee in full extension with 1 to 2 mm opening varus valgus stress.  The cutting block was applied to the distal femur using a size 6 cutting block dropping the block down 1-1/2 mm.  The 6 mm spacer block was placed into the flexion gap with the knee at 90 degrees of flexion and the medial side was slightly tight.  This resulted in further medial release of the soft tissue sleeve and the posteromedial corner   Rotation was assessed using Whitesides line and the epicondyles.  Once I was satisfied with the spacer block collateral ligament retractors were placed and I completed the 4 distal femoral cuts   The extension gap was rechecked with the size 6 spacer block.  The flexion and  extension gaps were now balanced and I proceeded to cut the notch in the femur.  Using a size 7 Notch cutting guide.  Trial reduction was completed using size 6 femur, 7 tibia and 6 poly trial implants. Patella tracking was normal  We then skeletonized the patella. It measured 23 in thickness and the patellar resection was set for 9.5 millimeters. the patellar resection was completed by hand after using the cutting guide. The patella diameter measured early 8. We then drilled the peg holes for the patella.  Completed patellar thickness was 23  The proximal tibia was prepared using the size 6 base plate.  Thorough irrigation was performed using saline  and the bone was dried and prepared for cement. The cement was mixed on the back table using third generation preparation techniques  The implants were then cemented in place and excess cement was removed. The cement was allowed to cure.  Normal saline irrigation followed by Surgipor irrigation followed by normal saline irrigation  Any excess bone fragments and cement were removed.  Closure was done with the following sutures  The extensor mechanism was closed with #1 Surgilon starting proximally and then  using #1 Vicryl distally.  Prior to complete closure Zynrelef  was injected using 2 vials  Interrupted 0 Monocryl followed by running 0 Monocryl was used to close the skin followed by skin staples  A sterile dressing was applied, TED hose were placed on the operative extremity followed by Cryo/Cuff.  The patient was taken recovery room in stable condition  Postop plan: Weightbearing as tolerated CPM machine Immediate physical therapy Discharge tomorrow if stable

## 2024-11-30 NOTE — Plan of Care (Signed)
" °  Problem: Acute Rehab PT Goals(only PT should resolve) Goal: Pt Will Go Supine/Side To Sit Outcome: Progressing Flowsheets (Taken 11/30/2024 1600) Pt will go Supine/Side to Sit: with contact guard assist Goal: Patient Will Transfer Sit To/From Stand Outcome: Progressing Flowsheets (Taken 11/30/2024 1600) Patient will transfer sit to/from stand: with supervision Goal: Pt Will Transfer Bed To Chair/Chair To Bed Outcome: Progressing Flowsheets (Taken 11/30/2024 1600) Pt will Transfer Bed to Chair/Chair to Bed: with supervision Goal: Pt Will Ambulate Outcome: Progressing Flowsheets (Taken 11/30/2024 1600) Pt will Ambulate:  > 125 feet  with least restrictive assistive device  with supervision Goal: Pt Will Go Up/Down Stairs Outcome: Progressing Flowsheets (Taken 11/30/2024 1600) Pt will Go Up / Down Stairs:  3-5 stairs  with rail(s)  with contact guard assist   4:00 PM, 11/30/2024 Lolitha Tortora Powell-Butler, PT, DPT [place] with [facility]  "

## 2024-11-30 NOTE — Anesthesia Procedure Notes (Signed)
 Spinal  Patient location during procedure: OR Start time: 11/30/2024 7:33 AM End time: 11/30/2024 7:36 AM Reason for block: surgical anesthesia  Staffing Performed: resident/CRNA  Authorized by: Kendell Yvonna PARAS, MD   Performed by: Pheobe Adine CROME, CRNA  Preanesthetic Checklist Completed: patient identified, IV checked, site marked, risks and benefits discussed, surgical consent, monitors and equipment checked, pre-op evaluation and timeout performed Spinal Block Patient position: sitting Prep: ChloraPrep Patient monitoring: heart rate, cardiac monitor, continuous pulse ox and blood pressure Approach: midline Location: L4-5 Injection technique: single-shot Needle Needle type: Spinocan  Needle gauge: 22 G Needle length: 10 cm Assessment Sensory level: T6 Events: CSF return

## 2024-12-01 ENCOUNTER — Other Ambulatory Visit: Payer: Self-pay | Admitting: Orthopedic Surgery

## 2024-12-01 ENCOUNTER — Encounter (HOSPITAL_COMMUNITY): Payer: Self-pay | Admitting: Orthopedic Surgery

## 2024-12-01 DIAGNOSIS — M1712 Unilateral primary osteoarthritis, left knee: Secondary | ICD-10-CM | POA: Diagnosis not present

## 2024-12-01 LAB — BASIC METABOLIC PANEL WITH GFR
Anion gap: 9 (ref 5–15)
BUN: 17 mg/dL (ref 8–23)
CO2: 24 mmol/L (ref 22–32)
Calcium: 8.2 mg/dL — ABNORMAL LOW (ref 8.9–10.3)
Chloride: 104 mmol/L (ref 98–111)
Creatinine, Ser: 0.78 mg/dL (ref 0.61–1.24)
GFR, Estimated: >60 mL/min
Glucose, Bld: 104 mg/dL — ABNORMAL HIGH (ref 70–99)
Potassium: 4.3 mmol/L (ref 3.5–5.1)
Sodium: 138 mmol/L (ref 135–145)

## 2024-12-01 LAB — CBC
HCT: 37.9 % — ABNORMAL LOW (ref 39.0–52.0)
Hemoglobin: 13.1 g/dL (ref 13.0–17.0)
MCH: 30.7 pg (ref 26.0–34.0)
MCHC: 34.6 g/dL (ref 30.0–36.0)
MCV: 88.8 fL (ref 80.0–100.0)
Platelets: 163 K/uL (ref 150–400)
RBC: 4.27 MIL/uL (ref 4.22–5.81)
RDW: 13.4 % (ref 11.5–15.5)
WBC: 10.9 K/uL — ABNORMAL HIGH (ref 4.0–10.5)
nRBC: 0 % (ref 0.0–0.2)

## 2024-12-01 MED ORDER — 3-IN-1 BEDSIDE TOILET MISC: 0 refills | Status: AC

## 2024-12-01 NOTE — Plan of Care (Signed)
" °  Problem: Education: Goal: Knowledge of the prescribed therapeutic regimen will improve Outcome: Progressing   Problem: Nutritional: Goal: Will attain and maintain optimal nutritional status Outcome: Progressing   Problem: Neurological: Goal: Will regain or maintain usual level of consciousness Outcome: Progressing   Problem: Clinical Measurements: Goal: Ability to maintain clinical measurements within normal limits Outcome: Progressing   Problem: Skin Integrity: Goal: Demonstrates signs of wound healing without infection Outcome: Progressing   Problem: Urinary Elimination: Goal: Will remain free from infection Outcome: Progressing Goal: Ability to achieve and maintain adequate urine output Outcome: Progressing   "

## 2024-12-01 NOTE — Progress Notes (Signed)
 Patient ID: Peter Mora, male   DOB: 01/06/1946, 79 y.o.   MRN: [id number]  Postop day #1  Procedure left total knee arthroplasty  Diagnosis osteoarthritis left knee  The patient's pain is well-controlled at 1-2 out of 10.  He is neurovascularly intact distally calf is soft Toula' sign is negative mild knee swelling expected  Cryo/Cuff functioning well  Today  1.  Continue physical therapy emphasizing home exercises, cryotherapy, stair training  2.  Patient should be able to be discharged today.     Latest Ref Rng & Units 12/01/2024    5:08 AM 11/24/2024    2:18 PM 10/03/2023   11:39 AM  CBC  WBC 4.0 - 10.5 K/uL 10.9  6.4  5.9   Hemoglobin 13.0 - 17.0 g/dL 86.8  84.4  83.9   Hematocrit 39.0 - 52.0 % 37.9  44.7  47.2   Platelets 150 - 400 K/uL 163  183  177

## 2024-12-01 NOTE — Progress Notes (Addendum)
 Sent in 3 in 1 per request from hospital

## 2024-12-01 NOTE — TOC Transition Note (Signed)
 Transition of Care [facility]) - Discharge Note   Patient Details  Name: Peter Mora MRN: [id number] Date of Birth: [birth year]  [facility] of [facility] CM/SW Contact:  Noreen KATHEE Pinal, LCSWA Phone Number: 12/01/2024, 9:30 AM   Clinical Narrative:     According to MD note, patient should be DC today back home with HHPT. CSW spoke with patient at bedside and he reports that prior to surgery he was independent with ADL's and driving himself where he needed to go . Patient friend at bedside as well and stated that they have 2 walkers and her old bedside commode, but requested for another one for patient weight. CSW reached out to Amy, the geographical information systems officer at [facility] [place] office regarding order for bedside commode. She confirmed that she will send order over to [facility] for patient to pick up. CSW did inform patient and friend of HHPT agency, [facility], that was arranged by Dr. Margrette office. Jennifer with [facility] updated on DC for DC. CSW signing off.   Final next level of care: Home w Home Health Services Barriers to Discharge: Barriers Resolved   Patient Goals and CMS Choice Patient states their goals for this hospitalization and ongoing recovery are:: Get better          Discharge Placement                Patient to be transferred to facility by: Friend Name of family member notified: Cordella Patient and family notified of of transfer: 12/01/24  Discharge Plan and Services Additional resources added to the After Visit Summary for                  DME Arranged: Bedside commode DME Agency: [facility] Date DME Agency Contacted: 12/01/24 Time DME Agency Contacted: [phone] Representative spoke with at DME Agency: Asked Amy at Dr. Margrette office for order to be sent to [facility] Arranged: PT [facility] Agency: [facility] Date [facility] Agency Contacted: 12/01/24 Time HH Agency Contacted: [phone] Representative spoke with at [facility]  Agency: [facility]  Social Drivers of Health (SDOH) Interventions SDOH Screenings   Food Insecurity: No Food Insecurity (12/01/2024)  Housing: Low Risk (12/01/2024)  Transportation Needs: No Transportation Needs (12/01/2024)  Utilities: Not At Risk (12/01/2024)  Alcohol Screen: Low Risk (05/06/2024)  Depression (PHQ2-9): Low Risk (11/03/2024)  Financial Resource Strain: Low Risk (05/06/2024)  Physical Activity: Inactive (05/06/2024)  Social Connections: Unknown (12/01/2024)  Stress: No Stress Concern Present (05/06/2024)  Tobacco Use: Medium Risk (11/30/2024)  Health Literacy: Adequate Health Literacy (05/06/2024)     Readmission Risk Interventions     No data to display

## 2024-12-01 NOTE — Discharge Summary (Signed)
 Physician Discharge Summary  Patient ID: Peter Mora MRN: [id number] DOB/AGE: 20-Feb-1946 79 y.o.  Admit date: 11/30/2024 Discharge date: 12/01/2024  Admission Diagnoses: left  Knee osteoarthritis  Discharge Diagnoses: left Knee osteoarthritis  Discharged Condition: Stable  Procedure: left total  knee arthroplasty  Hospital Course:  Hospital day 1 uncomplicated total knee arthroplasty left  knee -Participated in physical therapy was able to ambulate  50 +  FT with 88 degrees range of motion  Hospital day 2 the patient continued to do well remained afebrile with stable vital signs -Advanced in physical therapy tolerating the stairs with good pain control  Implant Name Type Inv. Item Serial No. Manufacturer Lot No. LRB No. Used Action  CEMENT HV SMART SET - [id number] Cement CEMENT HV SMART SET  [employer] [id number] Left 1 Implanted  CEMENT HV SMART SET - [id number] Cement CEMENT HV SMART SET  [employer] [id number] Left 1 Implanted  ATTUNE MED DOME PAT 38 KNEE - [id number] Knees ATTUNE MED DOME PAT 38 KNEE  [employer] [id number] Left 1 Implanted  ATTUNE PS FEM LT SZ 6 CEM KNEE - [id number] Femur ATTUNE PS FEM LT SZ 6 CEM KNEE  [employer] [id number] Left 1 Implanted  INSERT TIB ATTUNE FB SZ6X6 - [id number] Insert INSERT TIB ATTUNE FB SZ6X6  [employer] [id number] Left 1 Implanted  BASEPLATE TIB CEM ATTUNE SZ7 - [id number] Knees BASEPLATE TIB CEM ATTUNE SZ7  [employer] [id number] Left 1 Implanted    Lab reports:    Latest Ref Rng & Units 12/01/2024    5:08 AM 11/24/2024    2:18 PM 10/03/2023   11:39 AM  CBC  WBC 4.0 - 10.5 K/uL 10.9  6.4  5.9   Hemoglobin 13.0 - 17.0 g/dL 86.8  84.4  83.9   Hematocrit 39.0 - 52.0 % 37.9  44.7  47.2   Platelets 150 - 400 K/uL 163  183  177        Latest Ref Rng & Units 12/01/2024    5:08 AM 11/24/2024    2:18 PM 10/03/2023   11:39 AM  BMP  Glucose 70 - 99 mg/dL 895  88  68   BUN 8 - 23 mg/dL 17  26   27    Creatinine 0.61 - 1.24 mg/dL 9.21  9.13  9.18   BUN/Creat Ratio 10 - 24   33   Sodium 135 - 145 mmol/L 138  139  140   Potassium 3.5 - 5.1 mmol/L 4.3  3.9  4.4   Chloride 98 - 111 mmol/L 104  104  104   CO2 22 - 32 mmol/L 24  26  24    Calcium  8.9 - 10.3 mg/dL 8.2  9.2  9.0       Discharge Exam: BP 133/72 (BP Location: Left Arm)   Pulse 71   Temp 97.9 F (36.6 C) (Oral)   Resp 17   Ht 5' 6 (1.676 m)   Wt 116.4 kg   SpO2 98%   BMI 41.42 kg/m  Physical Exam  Peter Mora was awake and alert pain level 1-2 out of 10  Calf was soft supple Homans' sign negative neurovascular exam intact dressing dry  Minimal swelling   Disposition: Discharge disposition: 01-Home or Self Care     Home with home health  Discharge Instructions     CPM   Complete by: As directed    If your insurance company approved it you will have a continuous passive motion machine (CPM):  You  will use it in the following manner      Use the CPM from 0 to 90 for 6 hours per day.      You may increase by 10 per day.  You may break it up into 2 or 3 sessions per day.      Use CPM for 2 weeks or until you are told to stop.  After 2 weeks call the number on the machine and they will pick it up   Call MD / Call 911   Complete by: As directed    If you experience chest pain or shortness of breath, CALL 911 and be transported to the hospital emergency room.  If you develope a fever above 101 F, pus (white drainage) or increased drainage or redness at the wound, or calf pain, call your surgeon's office.   Change dressing   Complete by: As directed    Dressing changes not necessary  You may see the center of the dressing turned dark this is normal.  If the entire center of the dressing turns dark then the dressing needs to be changed  If this occurs please call the office and we will make arrangements for another dressing to be applied   Constipation Prevention   Complete by: As directed    Drink plenty  of fluids.  Prune juice may be helpful.  You may use a stool softener, such as Colace (over the counter) 100 mg twice a day.  Use MiraLax  (over the counter) for constipation as needed.   Discharge instructions   Complete by: As directed    You have a blue foam pillow// put the heel into the pillow. You will do this for 30 min 3 x a day until the doctor tells you to stop  The ice cuff should be used 6 x a day for 30 minutes   Do not put a pillow under the knee. Place it under the heel.   Complete by: As directed    Driving restrictions   Complete by: As directed    No driving for 2 weeks  Once you can safely get into a car after 2 weeks you can drive   Increase activity slowly as tolerated   Complete by: As directed    Post-operative opioid taper instructions:   Complete by: As directed    POST-OPERATIVE OPIOID TAPER INSTRUCTIONS: It is important to wean off of your opioid medication as soon as possible. If you do not need pain medication after your surgery it is ok to stop day one. Opioids include: Codeine, Hydrocodone (Norco, Vicodin), Oxycodone (Percocet, oxycontin ) and hydromorphone  amongst others.  Long term and even short term use of opiods can cause: Increased pain response Dependence Constipation Depression Respiratory depression And more.  Withdrawal symptoms can include Flu like symptoms Nausea, vomiting And more Techniques to manage these symptoms Hydrate well Eat regular healthy meals Stay active Use relaxation techniques(deep breathing, meditating, yoga) Do Not substitute Alcohol to help with tapering If you have been on opioids for less than two weeks and do not have pain than it is ok to stop all together.  Plan to wean off of opioids This plan should start within one week post op of your joint replacement. Maintain the same interval or time between taking each dose and first decrease the dose.  Cut the total daily intake of opioids by one tablet each day Next  start to increase the time between doses. The last dose that should be eliminated is  the evening dose.      TED hose   Complete by: As directed    Use stockings (TED hose) for 2 weeks on both leg(s).  You may remove them at night for sleeping.      Allergies as of 12/01/2024   No Known Allergies      Medication List     STOP taking these medications    fluticasone  50 MCG/ACT nasal spray Commonly known as: FLONASE    meloxicam  15 MG tablet Commonly known as: MOBIC        TAKE these medications    3-in-1 Bedside Toilet Misc Length of need 99 mo / M17.12 OSTEOARTHRITIS LEFT KNEE   acetaminophen  500 MG tablet Commonly known as: TYLENOL  Take 1 tablet (500 mg total) by mouth every 6 (six) hours as needed.   aspirin  EC 81 MG tablet Take 1 tablet (81 mg total) by mouth 2 (two) times daily. Swallow whole.   azelastine  0.1 % nasal spray Commonly known as: ASTELIN  Place 2 sprays into both nostrils 2 (two) times daily as needed. Use in each nostril as directed   CALCIUM -MAGNESIUM-ZINC PO Take 1 tablet by mouth daily.   celecoxib  200 MG capsule Commonly known as: CeleBREX  Take 1 capsule (200 mg total) by mouth 2 (two) times daily.   cetirizine  10 MG tablet Commonly known as: ZyrTEC  Allergy  Take 1 tablet (10 mg total) by mouth daily.   cholecalciferol  25 MCG (1000 UNIT) tablet Commonly known as: VITAMIN D3 Take 1,000 Units by mouth daily.   cyanocobalamin  1000 MCG tablet Commonly known as: VITAMIN B12 Take 1,000 mcg by mouth daily.   docusate sodium  100 MG capsule Commonly known as: Colace Take 1 capsule (100 mg total) by mouth 2 (two) times daily.   methocarbamol  500 MG tablet Commonly known as: ROBAXIN  Take 1 tablet (500 mg total) by mouth 3 (three) times daily.   multivitamin with minerals Tabs tablet Take 1 tablet by mouth daily.   olmesartan  20 MG tablet Commonly known as: BENICAR  Take 0.5 tablets (10 mg total) by mouth daily.   oxyCODONE  5 MG  immediate release tablet Commonly known as: Oxy IR/ROXICODONE  Take 1 tablet (5 mg total) by mouth every 4 (four) hours as needed for up to 5 days for severe pain (pain score 7-10).   pantoprazole  40 MG tablet Commonly known as: PROTONIX  Take 1 tablet by mouth once daily   POTASSIMIN PO Take 1 tablet by mouth daily.   sildenafil  100 MG tablet Commonly known as: Viagra  Take 1 tablet (100 mg total) by mouth daily as needed for erectile dysfunction.   traMADol  50 MG tablet Commonly known as: ULTRAM  Take 1 tablet (50 mg total) by mouth 4 (four) times daily for 5 days.               Discharge Care Instructions  (From admission, onward)           Start     Ordered   12/01/24 0000  Change dressing       Comments: Dressing changes not necessary  You may see the center of the dressing turned dark this is normal.  If the entire center of the dressing turns dark then the dressing needs to be changed  If this occurs please call the office and we will make arrangements for another dressing to be applied   12/01/24 0949            Contact information for follow-up providers     Margrette Bars  E, MD Follow up on 12/15/2024.   Specialties: Orthopedic Surgery, Radiology Why: For wound re-check, For suture removal Contact information: [address] [place] [address] [phone]              Contact information for after-discharge care     Home Medical Care     [facility]) .   Service: Home Health Services Contact information: [address] [place]  [address] [phone]                     Signed: Taft Minerva 12/01/2024, 9:50 AM

## 2024-12-01 NOTE — Progress Notes (Signed)
 Physical Therapy Treatment Patient Details Name: Peter Mora MRN: [id number] DOB: 06/03/1946 Today's Date: 12/01/2024   LEFT KNEE ROM: 0-100 degrees AMBULATION DISTANCE: 175 feet with Modified Independent/supervision using RW   History of Present Illness Peter Mora, 79 y.o. male, has a history of pain and functional disability in the left knee due to arthritis and has failed non-surgical conservative treatments for greater than 12 weeks to includeNSAID's and/or analgesics, corticosteriod injections, viscosupplementation injections, use of assistive devices, and activity modification.  Onset of symptoms was gradual, starting 8 years ago with gradually worsening course since that time. The patient noted prior procedures on the knee to include  arthroscopy on the left knee(s).  Patient currently rates pain in the left knee(s) AS INCREASING WITH activity. Patient has worsening of pain with activity and weight bearing and pain that interferes with activities of daily living.  Patient has evidence of subchondral cysts, subchondral sclerosis, periarticular osteophytes, and joint space narrowing by imaging studies.  There is no active infection.    PT Comments   Patient demonstrates fair/good return for going up/down steps using both hands on 1 side rail without loss of balance and understanding acknowledged. Patient demonstrates slightly labored movement with fair/good return for left heel to toe stepping during ambulation and tolerated sitting up at bedside with LLE dangling. Patient will benefit from continued skilled physical therapy in hospital and recommended venue below to increase strength, balance, endurance for safe ADLs and gait.    If plan is discharge home, recommend the following: A little help with walking and/or transfers;Assistance with cooking/housework;Assist for transportation;Help with stairs or ramp for entrance   Can travel by private vehicle        Equipment  Recommendations  None recommended by PT    Recommendations for Other Services       Precautions / Restrictions Precautions Precautions: Fall Recall of Precautions/Restrictions: Intact Restrictions Weight Bearing Restrictions Per Provider Order: Yes LLE Weight Bearing Per Provider Order: Weight bearing as tolerated     Mobility  Bed Mobility Overal bed mobility: Modified Independent             General bed mobility comments: good return for sitting up at bedside    Transfers Overall transfer level: Needs assistance Equipment used: Rolling walker (2 wheels) Transfers: Sit to/from Stand, Bed to chair/wheelchair/BSC Sit to Stand: Modified independent (Device/Increase time), Supervision   Step pivot transfers: Modified independent (Device/Increase time), Supervision       General transfer comment: slightly labored movement without loss of balance using RW    Ambulation/Gait Ambulation/Gait assistance: Modified independent (Device/Increase time), Supervision Gait Distance (Feet): 175 Feet Assistive device: Rolling walker (2 wheels) Gait Pattern/deviations: Decreased step length - left, Decreased step length - right, Decreased stance time - left, Decreased stride length, Antalgic Gait velocity: dec     General Gait Details: slightly labored movement with good return for left heel to toe stepping without loss of balance using RW   Stairs Stairs: Yes Stairs assistance: Supervision Stair Management: One rail Right, Step to pattern Number of Stairs: 5 General stair comments: demonstrates fairg/good return for going up/down steps using both hands on 1 side rail without loss of balance and understanding acknowledged   Wheelchair Mobility     Tilt Bed    Modified Rankin (Stroke Patients Only)       Balance Overall balance assessment: Needs assistance Sitting-balance support: Feet supported, No upper extremity supported Sitting balance-Leahy Scale:  Good Sitting balance - Comments: seated  at EOB   Standing balance support: During functional activity, Bilateral upper extremity supported Standing balance-Leahy Scale: Fair Standing balance comment: fair/good using RW                            Communication Communication Communication: No apparent difficulties  Cognition Arousal: Alert Behavior During Therapy: WFL for tasks assessed/performed   PT - Cognitive impairments: No apparent impairments                         Following commands: Intact      Cueing Cueing Techniques: Verbal cues, Visual cues  Exercises Total Joint Exercises Ankle Circles/Pumps: Supine, 10 reps, Left, Strengthening, AROM Quad Sets: AROM, Strengthening, Left, 10 reps, Supine Short Arc Quad: AROM, Strengthening, Left, 10 reps, Supine Heel Slides: Supine, 10 reps, Left, Strengthening, AROM Goniometric ROM: Left knee: 0 - 100 degrees    General Comments        Pertinent Vitals/Pain Pain Assessment Pain Assessment: 0-10 Pain Score: 2  Pain Location: left knee Pain Descriptors / Indicators: Discomfort, Sore    Home Living                          Prior Function            PT Goals (current goals can now be found in the care plan section) Acute Rehab PT Goals Patient Stated Goal: Return home with HHPT PT Goal Formulation: With patient Time For Goal Achievement: 12/03/24 Potential to Achieve Goals: Good Progress towards PT goals: Progressing toward goals    Frequency    BID      PT Plan      Co-evaluation              AM-PAC PT 6 Clicks Mobility   Outcome Measure  Help needed turning from your back to your side while in a flat bed without using bedrails?: None Help needed moving from lying on your back to sitting on the side of a flat bed without using bedrails?: None Help needed moving to and from a bed to a chair (including a wheelchair)?: A Little Help needed standing up from a chair  using your arms (e.g., wheelchair or bedside chair)?: A Little Help needed to walk in hospital room?: A Little Help needed climbing 3-5 steps with a railing? : A Little 6 Click Score: 20    End of Session Equipment Utilized During Treatment: Gait belt Activity Tolerance: Patient tolerated treatment well;Patient limited by fatigue Patient left: in bed;with call bell/phone within reach;Other (comment) (left seated at bedside) Nurse Communication: Mobility status PT Visit Diagnosis: Difficulty in walking, not elsewhere classified (R26.2);Other abnormalities of gait and mobility (R26.89);Muscle weakness (generalized) (M62.81);Pain Pain - Right/Left: Left Pain - part of body: Knee     Time: 9145-9080 PT Time Calculation (min) (ACUTE ONLY): 25 min  Charges:    $Gait Training: 8-22 mins $Therapeutic Exercise: 8-22 mins PT General Charges $$ ACUTE PT VISIT: 1 Visit                     10:22 AM, 12/01/2024 Lynwood Music, MPT Physical Therapist with [facility] [address] [phone] office [phone] mobile phone

## 2024-12-03 NOTE — Anesthesia Postprocedure Evaluation (Signed)
"   Anesthesia Post Note  Patient: Peter Mora  Procedure(s) Performed: ARTHROPLASTY, KNEE, TOTAL (Left: Knee)  Patient location during evaluation: Phase II Anesthesia Type: Regional Level of consciousness: awake Pain management: pain level controlled Vital Signs Assessment: post-procedure vital signs reviewed and stable Respiratory status: spontaneous breathing and respiratory function stable Cardiovascular status: blood pressure returned to baseline and stable Postop Assessment: no headache and no apparent nausea or vomiting Anesthetic complications: no Comments: Late entry   No notable events documented.   Last Vitals:  Vitals:   11/30/24 2004 12/01/24 0617  BP: 123/79 133/72  Pulse: 85 71  Resp: 18 17  Temp: 36.6 C 36.6 C  SpO2: 98% 98%    Last Pain:  Vitals:   12/01/24 1515  TempSrc:   PainSc: 4                  Yvonna PARAS Warren Lindahl      "

## 2024-12-07 ENCOUNTER — Encounter: Payer: Self-pay | Admitting: Orthopedic Surgery

## 2024-12-07 ENCOUNTER — Telehealth: Payer: Self-pay | Admitting: Radiology

## 2024-12-07 ENCOUNTER — Other Ambulatory Visit: Payer: Self-pay | Admitting: Orthopedic Surgery

## 2024-12-07 MED ORDER — OXYCODONE HCL 5 MG PO CAPS: 5.0000 mg | ORAL_CAPSULE | ORAL | 0 refills | Status: DC | PRN

## 2024-12-07 NOTE — Telephone Encounter (Signed)
 Dr. Areatha pt [facility] Mare w/[facility] [phone] lvm stating that Oxycodone  capsules was prescribed and the insurance prefers tablets.  She would like to get an updated script so they can fill this for the pt.

## 2024-12-07 NOTE — Telephone Encounter (Signed)
 My chart message about meds

## 2024-12-07 NOTE — Telephone Encounter (Signed)
 I have completed a prior authorization request on cover my meds for the Oxycodone 

## 2024-12-08 MED ORDER — OXYCODONE HCL 5 MG PO TABS: 5.0000 mg | ORAL_TABLET | ORAL | 0 refills | Status: DC | PRN

## 2024-12-09 LAB — TYPE AND SCREEN
ABO/RH(D): O POS
Antibody Screen: NEGATIVE
Unit division: 0
Unit division: 0

## 2024-12-09 LAB — BPAM RBC
Blood Product Expiration Date: 202602022359
Blood Product Expiration Date: 202602022359
Unit Type and Rh: 5100
Unit Type and Rh: 5100

## 2024-12-13 NOTE — Therapy (Signed)
 " OUTPATIENT PHYSICAL THERAPY LOWER EXTREMITY EVALUATION   Patient Name: Peter Mora MRN: [id number] DOB:[birth year], 79 y.o., male Today's Date: 12/14/2024  END OF SESSION:  PT End of Session - 12/14/24 1522     Visit Number 1    Number of Visits 12    Date for Recertification  01/25/25    Authorization Type HTA    Authorization Time Period no auth required    PT Start Time 1420    PT Stop Time 1500    PT Time Calculation (min) 40 min    Activity Tolerance Patient tolerated treatment well    Behavior During Therapy WFL for tasks assessed/performed          Past Medical History:  Diagnosis Date   Arthritis    in both knees   GERD (gastroesophageal reflux disease)    Sleep apnea    Past Surgical History:  Procedure Laterality Date   BACK SURGERY     excision of melanoma   BIOPSY  04/07/2018   Procedure: BIOPSY;  Surgeon: Harvey Margo CROME, MD;  [facility]: [facility];  Service: Endoscopy;;  random colon biopsy   BIOPSY  11/23/2019   Procedure: BIOPSY;  Surgeon: Harvey Margo CROME, MD;  Location: [facility];  Service: Endoscopy;;  duodenal bulb, second portion of duodenal , duodenal nodule    COLONOSCOPY WITH PROPOFOL  N/A 04/07/2018   one 4 mm hyperplastic rectal polyp, redundant left colon, external and internal hemorrhoids   ESOPHAGOGASTRODUODENOSCOPY (EGD) WITH PROPOFOL  N/A 04/07/2018   Negative Barrett's. medium sized hiatal hernia. moderate gastritis/duodenitis   ESOPHAGOGASTRODUODENOSCOPY (EGD) WITH PROPOFOL  N/A 11/23/2019   Procedure: ESOPHAGOGASTRODUODENOSCOPY (EGD) WITH PROPOFOL ;  Surgeon: Harvey Margo CROME, MD;  [facility];  Service: Endoscopy;  Laterality: N/A;  11:15am   KNEE ARTHROSCOPY WITH MEDIAL MENISECTOMY Left 10/02/2017   Procedure: LEFT KNEE ARTHROSCOPY WITH PARTIAL MEDIAL MENISECTOMY;  Surgeon: Margrette Taft BRAVO, MD;  Location: [facility];  Service: Orthopedics;  Laterality: Left;   POLYPECTOMY  04/07/2018   Procedure: POLYPECTOMY;  Surgeon:  Harvey Margo CROME, MD;  Location: [facility];  Service: Endoscopy;;  rectal polyp cs   TOTAL KNEE ARTHROPLASTY Left 11/30/2024   Procedure: ARTHROPLASTY, KNEE, TOTAL;  Surgeon: Margrette Taft BRAVO, MD;  Location: [facility];  Service: Orthopedics;  Laterality: Left;   Patient Active Problem List   Diagnosis Date Noted   Primary localized osteoarthritis of knee 11/30/2024   Encounter for general adult medical examination with abnormal findings 11/03/2024   Prostate cancer screening 11/03/2024   Encounter for Medicare annual wellness exam 05/06/2024   Erectile dysfunction 12/11/2023   Chronic frontal sinusitis 10/03/2023   Elbow arthritis 10/03/2023   Mixed hyperlipidemia 10/03/2023   Uncomplicated alcohol dependence (HCC) 10/03/2023   Essential hypertension 10/03/2023   Primary osteoarthritis of left knee 10/03/2023   Diarrhea 08/25/2019   GERD (gastroesophageal reflux disease) 08/10/2018   Polyp of rectum    Change in bowel habits 03/05/2018   OSA (obstructive sleep apnea) 02/02/2018   Snoring 01/01/2018   Colon polyp 01/01/2018   Family history of colon cancer 01/01/2018   Heavy drinker of alcohol 01/01/2018   Body mass index (BMI) of 40.0-44.9 in adult [facility]) 01/01/2018   Abnormal EKG 01/01/2018   S/P left knee arthroscopy 10/02/17 10/06/2017   Seborrheic keratosis 07/25/2017    PCP: Tobie Suzzane POUR, MD  REFERRING PROVIDER: Margrette Taft BRAVO, MD  REFERRING DIAG: [phone] (ICD-10-CM) - Primary osteoarthritis of left knee  THERAPY DIAG:  Difficulty  in walking, not elsewhere classified - Plan: PT plan of care cert/re-cert  Knee stiffness, left - Plan: PT plan of care cert/re-cert  Acute pain of left knee - Plan: PT plan of care cert/re-cert  Primary osteoarthritis of left knee - Plan: PT plan of care cert/re-cert  Muscle weakness (generalized) - Plan: PT plan of care cert/re-cert  Rationale for Evaluation and Treatment: Rehabilitation  ONSET DATE: 11/30/24; Left  TKA  SUBJECTIVE:   SUBJECTIVE STATEMENT: Pt is 2 week s/p left TKA. Currently, he is in 1/10 pain. He reports that pain gets up to 4/10 after exercise. He reports that pain wakes him from sleep 1-2 times per night. If his leg twitches or he contracts too fast, his pain briefly gets up to an 8/10 but resolves quickly.He was discharged this morning from home health this morning.   PERTINENT HISTORY: Arthritis in both knees. PAIN:  Are you having pain? Yes: NPRS scale: 1-4/10 Pain location: left TKA Pain description: Sharp, achy Aggravating factors: Exercise Relieving factors: Oxycodone , Ice   PRECAUTIONS: Knee  RED FLAGS: None   WEIGHT BEARING RESTRICTIONS: No  FALLS:  Has patient fallen in last 6 months? No  LIVING ENVIRONMENT: Lives with:  Lives in: House/apartment Stairs: Yes: External: 2 steps; can reach both Internal: 9 Has following equipment at home: Vannie - 2 wheeled  OCCUPATION: Licensed conveyancer  PLOF: Independent with basic ADLs  PATIENT GOALS: Be in shop working on wood working for 8-9 hours a day  NEXT MD VISIT: 12/15/24  OBJECTIVE:  Note: Objective measures were completed at Evaluation unless otherwise noted.  DIAGNOSTIC FINDINGS:   PATIENT SURVEYS:  LEFS  Extreme difficulty/unable (0), Quite a bit of difficulty (1), Moderate difficulty (2), Little difficulty (3), No difficulty (4) Survey date:    Any of your usual work, housework or school activities   2. Usual hobbies, recreational or sporting activities   3. Getting into/out of the bath   4. Walking between rooms   5. Putting on socks/shoes   6. Squatting    7. Lifting an object, like a bag of groceries from the floor   8. Performing light activities around your home   9. Performing heavy activities around your home   10. Getting into/out of a car   11. Walking 2 blocks   12. Walking 1 mile   13. Going up/down 10 stairs (1 flight)   14. Standing for 1 hour   15.  sitting for 1 hour   16.  Running on even ground   17. Running on uneven ground   18. Making sharp turns while running fast   19. Hopping    20. Rolling over in bed   Score total:  27/80; 33.8     COGNITION: Overall cognitive status: Within functional limits for tasks assessed     SENSATION: Not tested   POSTURE: weight shift right  PALPATION: General soreness normal for this time s/p.  LOWER EXTREMITY ROM:  Active ROM Right eval Left eval  Hip flexion    Hip extension    Hip abduction    Hip adduction    Hip internal rotation    Hip external rotation    Knee flexion 136 98  Knee extension -2 -14  Ankle dorsiflexion    Ankle plantarflexion    Ankle inversion    Ankle eversion     (Blank rows = not tested)  LOWER EXTREMITY MMT:* sore/painful  MMT Right eval Left eval  Hip flexion 4+ 3-*  Hip extension    Hip abduction    Hip adduction    Hip internal rotation    Hip external rotation    Knee flexion    Knee extension 5 3*  Ankle dorsiflexion 5 4+*  Ankle plantarflexion    Ankle inversion    Ankle eversion     (Blank rows = not tested)   FUNCTIONAL TESTS:  2 minute walk test: 358 ft 5 time sit to stand: 13.91 sec  GAIT: Distance walked: 358 ft Assistive device utilized:   and None Level of assistance: Complete Independence Comments: Decreased step length on right foot, antalgic, wide BOS                                                                                                                                TREATMENT DATE: 12/14/2024 physical therapy evaluation and HEP    PATIENT EDUCATION:  Education details: Patient educated on exam findings, POC, scope of PT, HEP, and what to expect next visit. Person educated: Patient Education method: Explanation, Demonstration, and Handouts Education comprehension: verbalized understanding, returned demonstration, verbal cues required, and tactile cues required   HOME EXERCISE PROGRAM: Access Code: [email or web address] URL:  [email or web address] Date: 12/14/2024 Prepared by: [facility]  Exercises - Supine Ankle Pumps  - 3 x daily - 1 sets - 15-20 reps - Supine Quad Set  - 3 x daily - 1 sets - 15-20 reps - 3 sec hold - Supine Heel Slide with Strap  - 3 x daily - 3 sets - 15-20 reps - 3 sec hold - Small Range Straight Leg Raise  - 3 x daily - 1 sets - 10 reps - Seated Knee Flexion  - 3 x daily - 1 sets - 15 reps - 3 sec hold - Seated Long Arc Quad  - 3 x daily - 1 sets - 10 reps - 5s hold  ASSESSMENT:  CLINICAL IMPRESSION: Patient is a 79 y.o. male who was seen today for physical therapy evaluation and treatment for M17.12 (ICD-10-CM) - Primary osteoarthritis of left knee.  Patient demonstrates muscle weakness, reduced ROM, and fascial restrictions which are likely contributing to symptoms of pain and are negatively impacting patient ability to perform ADLs and functional mobility tasks. Patient will benefit from skilled physical therapy services to address these deficits to reduce pain and improve level of function with ADLs and functional mobility tasks.   OBJECTIVE IMPAIRMENTS: decreased balance, difficulty walking, decreased ROM, decreased strength, hypomobility, increased edema, and pain.   ACTIVITY LIMITATIONS: carrying, lifting, bending, standing, squatting, sleeping, bed mobility, dressing, and locomotion level  PARTICIPATION LIMITATIONS: meal prep, cleaning, community activity, and occupation  REHAB POTENTIAL: Good  CLINICAL DECISION MAKING: Evolving/moderate complexity  EVALUATION COMPLEXITY: Moderate   GOALS: Goals reviewed with patient? No  SHORT TERM GOALS: Target date: 01/04/2025 patient will be independent with initial HEP and compliant with HEP 3-4 times a week   Baseline: Goal status: INITIAL  2.  Patient will report 50% improvement overall  Baseline:  Goal status: INITIAL  3.  Patient will increase knee mobility to -8 to 105 to promote normal navigation of steps;  step over step pattern Baseline: see above Goal status: INITIAL  LONG TERM GOALS: Target date: 01/25/2025  Patient will be independent in self management strategies to improve quality of life and functional outcomes.  Baseline:  Goal status: INITIAL  2.  Patient will report 70% improvement overall  Baseline:  Goal status: INITIAL  3.  Patient will improve LEFS score by 13 points (40/80) to demonstrate improved perceived function  Baseline:  Goal status: INITIAL  4.  Patient will increase distance on 2 MWT to 400 ft or moreto demonstrate improved speed and efficiency with household and community ambulation  Baseline: 358 ft Goal status: INITIAL  5.  Patient will increase knee mobility to -2 to 120 to promote normal navigation of steps; step over step pattern  Baseline:  Goal status: INITIAL  6.  Patient will improve MMT to at least 4+/5 to be able to [facility] for a couple of hours without having to rest.  Baseline:  Goal status: INITIAL   PLAN:  PT FREQUENCY: 2x/week  PT DURATION: 6 weeks  PLANNED INTERVENTIONS: [id number]- PT Re-evaluation, 97110-Therapeutic exercises, 97530- Therapeutic activity, 97112- Neuromuscular re-education, 97535- Self Care, [id number]- Manual therapy, [id number]- Gait training, [phone]- Orthotic Fit/training, [phone]- Canalith repositioning, [id number]- Aquatic Therapy, 97760- Splinting, 97597- Wound care (first 20 sq cm), 97598- Wound care (each additional 20 sq cm)Patient/Family education, Balance training, Stair training, Taping, Dry Needling, Joint mobilization, Joint manipulation, Spinal manipulation, Spinal mobilization, Scar mobilization, and DME instructions.   PLAN FOR NEXT SESSION: Review HEP and goals. Interventions for LE mobility and strength. Introduce balance and gait training when appropriate.   3:24 PM, 12/14/24 Jolanta Cabeza Small Myriah Boggus MPT  physical therapy [place] [phone] [phone]   "

## 2024-12-14 ENCOUNTER — Other Ambulatory Visit: Payer: Self-pay

## 2024-12-14 ENCOUNTER — Ambulatory Visit (HOSPITAL_COMMUNITY): Attending: Orthopedic Surgery

## 2024-12-14 DIAGNOSIS — M25562 Pain in left knee: Secondary | ICD-10-CM | POA: Diagnosis present

## 2024-12-14 DIAGNOSIS — M25662 Stiffness of left knee, not elsewhere classified: Secondary | ICD-10-CM | POA: Diagnosis present

## 2024-12-14 DIAGNOSIS — M1712 Unilateral primary osteoarthritis, left knee: Secondary | ICD-10-CM | POA: Insufficient documentation

## 2024-12-14 DIAGNOSIS — M6281 Muscle weakness (generalized): Secondary | ICD-10-CM | POA: Diagnosis present

## 2024-12-14 DIAGNOSIS — R262 Difficulty in walking, not elsewhere classified: Secondary | ICD-10-CM | POA: Insufficient documentation

## 2024-12-15 ENCOUNTER — Ambulatory Visit: Admitting: Orthopedic Surgery

## 2024-12-15 ENCOUNTER — Encounter: Payer: Self-pay | Admitting: Orthopedic Surgery

## 2024-12-15 DIAGNOSIS — Z96652 Presence of left artificial knee joint: Secondary | ICD-10-CM

## 2024-12-15 DIAGNOSIS — M25569 Pain in unspecified knee: Secondary | ICD-10-CM

## 2024-12-15 DIAGNOSIS — G8918 Other acute postprocedural pain: Secondary | ICD-10-CM

## 2024-12-15 MED ORDER — OXYCODONE HCL 5 MG PO TABS: 5.0000 mg | ORAL_TABLET | Freq: Four times a day (QID) | ORAL | 0 refills | Status: AC | PRN

## 2024-12-15 NOTE — Patient Instructions (Signed)
 You can drive as long as you are not taking oxycodone   Oxycodone  take 1 every 6 hours now we will change it to hydrocodone  in 2 weeks  Continue Robaxin  as needed for soreness  When Celebrex  runs out resume meloxicam   Aspirin  take 4 more weeks

## 2024-12-15 NOTE — Progress Notes (Signed)
"  ° °  Patient ID: Peter Mora, male   DOB: Oct 30, 1946, 79 y.o.   MRN: [id number]    POST OP VISIT  POD # 1   Patient: Peter Mora           Date of Birth: 07-23-1946           MRN: [id number] Visit Date: 12/15/2024 Requested by: Tobie Suzzane POUR, MD [address] [place],  [place] [address] PCP: Tobie Suzzane POUR, MD  Chief Complaint  Patient presents with   Post-op Follow-up    Knee replacement left 11/30/24    Encounter Diagnoses  Name Primary?   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    Postoperative pain, acute, knee Yes    PROCEDURE: Left total knee  SUBJECTIVE:   No complaints patient does take his oxycodone  4 times a day  EXAM FINDINGS:   INCISION: no sign of infection, staples taken out  Walking unsupported, 90 degrees flexion   Assessment and plan:  Doing well  Continue aspirin  for 4 weeks, continue Celebrex  and then resume meloxicam  when the Celebrex  runs out  Take the Robaxin  as needed  Discussed oxycodone  and weaning it but refilling today  Okay to drive as long as he is not on any opioids  PT continue at [facility] outpatient  in 3 week(s)  Meds ordered this encounter  Medications   oxyCODONE  (OXY IR/ROXICODONE ) 5 MG immediate release tablet    Sig: Take 1 tablet (5 mg total) by mouth every 6 (six) hours as needed for severe pain (pain score 7-10).    Dispense:  30 tablet    Refill:  0    "

## 2024-12-15 NOTE — Progress Notes (Signed)
" ° ° °  12/15/2024   Chief Complaint  Patient presents with   Post-op Follow-up    Knee replacement left 11/30/24    Encounter Diagnosis  Name Primary?   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 ___[facility]________________________  DOI/DOS/ Date: 11/30/24  Did you get better, worse or no change (Answer below)   Improved has started OP PT walking without walker needs refill Oxycodone       "

## 2024-12-17 ENCOUNTER — Encounter (HOSPITAL_COMMUNITY): Payer: Self-pay

## 2024-12-17 ENCOUNTER — Ambulatory Visit (HOSPITAL_COMMUNITY)

## 2024-12-17 DIAGNOSIS — M25662 Stiffness of left knee, not elsewhere classified: Secondary | ICD-10-CM

## 2024-12-17 DIAGNOSIS — M25562 Pain in left knee: Secondary | ICD-10-CM

## 2024-12-17 DIAGNOSIS — R262 Difficulty in walking, not elsewhere classified: Secondary | ICD-10-CM

## 2024-12-17 DIAGNOSIS — M1712 Unilateral primary osteoarthritis, left knee: Secondary | ICD-10-CM

## 2024-12-17 DIAGNOSIS — M6281 Muscle weakness (generalized): Secondary | ICD-10-CM

## 2024-12-17 NOTE — Therapy (Addendum)
 " OUTPATIENT PHYSICAL THERAPY LOWER EXTREMITY TREATMENT   Patient Name: Peter Mora MRN: [id number] DOB:09-21-1946, 79 y.o., male Today's Date: 12/17/2024  END OF SESSION:  PT End of Session - 12/17/24 1437     Visit Number 2    Number of Visits 12    Date for Recertification  01/25/25    Authorization Type HTA    Authorization Time Period no auth required    PT Start Time 1410    PT Stop Time 1451    PT Time Calculation (min) 41 min    Activity Tolerance Patient tolerated treatment well    Behavior During Therapy WFL for tasks assessed/performed           Past Medical History:  Diagnosis Date   Arthritis    in both knees   GERD (gastroesophageal reflux disease)    Sleep apnea    Past Surgical History:  Procedure Laterality Date   BACK SURGERY     excision of melanoma   BIOPSY  04/07/2018   Procedure: BIOPSY;  Surgeon: Harvey Margo CROME, MD;  Location: [facility];  Service: Endoscopy;;  random colon biopsy   BIOPSY  11/23/2019   Procedure: BIOPSY;  Surgeon: Harvey Margo CROME, MD;  Location: [facility];  Service: Endoscopy;;  duodenal bulb, second portion of duodenal , duodenal nodule    COLONOSCOPY WITH PROPOFOL  N/A 04/07/2018   one 4 mm hyperplastic rectal polyp, redundant left colon, external and internal hemorrhoids   ESOPHAGOGASTRODUODENOSCOPY (EGD) WITH PROPOFOL  N/A 04/07/2018   Negative Barrett's. medium sized hiatal hernia. moderate gastritis/duodenitis   ESOPHAGOGASTRODUODENOSCOPY (EGD) WITH PROPOFOL  N/A 11/23/2019   Procedure: ESOPHAGOGASTRODUODENOSCOPY (EGD) WITH PROPOFOL ;  Surgeon: Harvey Margo CROME, MD;  Location: [facility];  Service: Endoscopy;  Laterality: N/A;  11:15am   KNEE ARTHROSCOPY WITH MEDIAL MENISECTOMY Left 10/02/2017   Procedure: LEFT KNEE ARTHROSCOPY WITH PARTIAL MEDIAL MENISECTOMY;  Surgeon: Margrette Taft BRAVO, MD;  Location: [facility];  Service: Orthopedics;  Laterality: Left;   POLYPECTOMY  04/07/2018   Procedure: POLYPECTOMY;  Surgeon:  Harvey Margo CROME, MD;  Location: [facility];  Service: Endoscopy;;  rectal polyp cs   TOTAL KNEE ARTHROPLASTY Left 11/30/2024   Procedure: ARTHROPLASTY, KNEE, TOTAL;  Surgeon: Margrette Taft BRAVO, MD;  Location: [facility];  Service: Orthopedics;  Laterality: Left;   Patient Active Problem List   Diagnosis Date Noted   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 12/15/2024   Primary localized osteoarthritis of knee 11/30/2024   Encounter for general adult medical examination with abnormal findings 11/03/2024   Prostate cancer screening 11/03/2024   Encounter for Medicare annual wellness exam 05/06/2024   Erectile dysfunction 12/11/2023   Chronic frontal sinusitis 10/03/2023   Elbow arthritis 10/03/2023   Mixed hyperlipidemia 10/03/2023   Uncomplicated alcohol dependence (HCC) 10/03/2023   Essential hypertension 10/03/2023   Primary osteoarthritis of left knee 10/03/2023   Diarrhea 08/25/2019   GERD (gastroesophageal reflux disease) 08/10/2018   Polyp of rectum    Change in bowel habits 03/05/2018   OSA (obstructive sleep apnea) 02/02/2018   Snoring 01/01/2018   Colon polyp 01/01/2018   Family history of colon cancer 01/01/2018   Heavy drinker of alcohol 01/01/2018   Body mass index (BMI) of 40.0-44.9 in adult [facility]) 01/01/2018   Abnormal EKG 01/01/2018   S/P left knee arthroscopy 10/02/17 10/06/2017   Seborrheic keratosis 07/25/2017    PCP: Tobie Suzzane POUR, MD  REFERRING PROVIDER: Margrette Taft BRAVO, MD  REFERRING DIAG: [phone] (ICD-10-CM) -  Primary osteoarthritis of left knee  THERAPY DIAG:  Difficulty in walking, not elsewhere classified  Knee stiffness, left  Acute pain of left knee  Muscle weakness (generalized)  Primary osteoarthritis of left knee  Rationale for Evaluation and Treatment: Rehabilitation  ONSET DATE: 11/30/24; Left TKA  SUBJECTIVE:   SUBJECTIVE STATEMENT: Pt reports being on his feet and walking for 2 hours this morning which has increased his  pain. He is currently at a 3/10 and his knee feels very stiff.   Eval: Pt is 2 week s/p left TKA. Currently, he is in 1/10 pain. He reports that pain gets up to 4/10 after exercise. He reports that pain wakes him from sleep 1-2 times per night. If his leg twitches or he contracts too fast, his pain briefly gets up to an 8/10 but resolves quickly.He was discharged this morning from home health this morning.   PERTINENT HISTORY: Arthritis in both knees. PAIN:  Are you having pain? Yes: NPRS scale: 1-4/10 Pain location: left TKA Pain description: Sharp, achy Aggravating factors: Exercise Relieving factors: Oxycodone , Ice   PRECAUTIONS: Knee  RED FLAGS: None   WEIGHT BEARING RESTRICTIONS: No  FALLS:  Has patient fallen in last 6 months? No  LIVING ENVIRONMENT: Lives with:  Lives in: House/apartment Stairs: Yes: External: 2 steps; can reach both Internal: 9 Has following equipment at home: Vannie - 2 wheeled  OCCUPATION: Licensed conveyancer  PLOF: Independent with basic ADLs  PATIENT GOALS: Be in shop working on wood working for 8-9 hours a day  NEXT MD VISIT: 12/15/24  OBJECTIVE:  Note: Objective measures were completed at Evaluation unless otherwise noted.  DIAGNOSTIC FINDINGS:   PATIENT SURVEYS:  LEFS  Extreme difficulty/unable (0), Quite a bit of difficulty (1), Moderate difficulty (2), Little difficulty (3), No difficulty (4) Survey date:    Any of your usual work, housework or school activities   2. Usual hobbies, recreational or sporting activities   3. Getting into/out of the bath   4. Walking between rooms   5. Putting on socks/shoes   6. Squatting    7. Lifting an object, like a bag of groceries from the floor   8. Performing light activities around your home   9. Performing heavy activities around your home   10. Getting into/out of a car   11. Walking 2 blocks   12. Walking 1 mile   13. Going up/down 10 stairs (1 flight)   14. Standing for 1 hour   15.   sitting for 1 hour   16. Running on even ground   17. Running on uneven ground   18. Making sharp turns while running fast   19. Hopping    20. Rolling over in bed   Score total:  27/80; 33.8     COGNITION: Overall cognitive status: Within functional limits for tasks assessed     SENSATION: Not tested   POSTURE: weight shift right  EDEMA: 12/17/24  Circumferential measurement at tibiofemoral joint line: Left: 46 cm Right: 41 cm   PALPATION: General soreness normal for this time s/p.  LOWER EXTREMITY ROM:  Active ROM Right eval Left eval  Hip flexion    Hip extension    Hip abduction    Hip adduction    Hip internal rotation    Hip external rotation    Knee flexion 136 98  Knee extension -2 -14  Ankle dorsiflexion    Ankle plantarflexion    Ankle inversion    Ankle eversion     (  Blank rows = not tested)  LOWER EXTREMITY MMT:* sore/painful  MMT Right eval Left eval  Hip flexion 4+ 3-*  Hip extension    Hip abduction    Hip adduction    Hip internal rotation    Hip external rotation    Knee flexion    Knee extension 5 3*  Ankle dorsiflexion 5 4+*  Ankle plantarflexion    Ankle inversion    Ankle eversion     (Blank rows = not tested)   FUNCTIONAL TESTS:  2 minute walk test: 358 ft 5 time sit to stand: 13.91 sec  GAIT: Distance walked: 358 ft Assistive device utilized:   and None Level of assistance: Complete Independence Comments: Decreased step length on right foot, antalgic, wide BOS                                                                                                                                TREATMENT DATE:                                   12/17/24  EXERCISE LOG  Exercise Repetitions and Resistance Comments  Recumbent bike  Seat 9 x 6 minutes Not able to get full rotations. Rocking.  Lunge stretch 6 inch step x 2 minutes Bilateral UE support from parallel bars.  Step ups 4 inch step x 10 reps Bilateral UE support from  parallel bars.  Manual therapy 9 minutes STM to quads, hamstring, and IT band following report of stiffness. Max PROM 95 degrees.  Edema measurement Left: 46 cm Right: 41 cm   Quad sets  10 reps Supine with towel under knee.  Heel slides with towel 15 reps seated  Toe raises 20 reps seated  Heel raises 20 reps seated  Thomas stretch 2 x 1 minute hold    Blank cell = exercise not performed today   12/14/2024 physical therapy evaluation and HEP    PATIENT EDUCATION:  Education details: Patient educated on exam findings, POC, scope of PT, HEP, and what to expect next visit. Person educated: Patient Education method: Explanation, Demonstration, and Handouts Education comprehension: verbalized understanding, returned demonstration, verbal cues required, and tactile cues required   HOME EXERCISE PROGRAM: Access Code: [email or web address] URL: [email or web address] Date: 12/14/2024 Prepared by: [facility]  Exercises - Supine Ankle Pumps  - 3 x daily - 1 sets - 15-20 reps - Supine Quad Set  - 3 x daily - 1 sets - 15-20 reps - 3 sec hold - Supine Heel Slide with Strap  - 3 x daily - 3 sets - 15-20 reps - 3 sec hold - Small Range Straight Leg Raise  - 3 x daily - 1 sets - 10 reps - Seated Knee Flexion  - 3 x daily - 1 sets - 15 reps - 3 sec hold - Seated Long Arc Quad  -  3 x daily - 1 sets - 10 reps - 5s hold  ASSESSMENT:  CLINICAL IMPRESSION: Pt was introduced to recumbent bike to encourage knee flexion. He began performing quad strengthening interventions, but modified interventions to focus on quad control. Manual therapy was performed to the left quads, hamstring, and IT band with a palpable increase in tone after reporting stiffness during step ups. He experienced a temporary reduction in his familiar symptoms after manual therapy. Intervention was limited today due to pain and stiffness in the left knee. He reported a moderate increase in pain at the end of treatment today. Pt  will continue to benefit from skilled physical therapy services to increase LE strength, ROM, and reduce pain to decrease functional limitations.  Eval: Patient is a 79 y.o. male who was seen today for physical therapy evaluation and treatment for M17.12 (ICD-10-CM) - Primary osteoarthritis of left knee.  Patient demonstrates muscle weakness, reduced ROM, and fascial restrictions which are likely contributing to symptoms of pain and are negatively impacting patient ability to perform ADLs and functional mobility tasks. Patient will benefit from skilled physical therapy services to address these deficits to reduce pain and improve level of function with ADLs and functional mobility tasks.   OBJECTIVE IMPAIRMENTS: decreased balance, difficulty walking, decreased ROM, decreased strength, hypomobility, increased edema, and pain.   ACTIVITY LIMITATIONS: carrying, lifting, bending, standing, squatting, sleeping, bed mobility, dressing, and locomotion level  PARTICIPATION LIMITATIONS: meal prep, cleaning, community activity, and occupation  REHAB POTENTIAL: Good  CLINICAL DECISION MAKING: Evolving/moderate complexity  EVALUATION COMPLEXITY: Moderate   GOALS: Goals reviewed with patient? No  SHORT TERM GOALS: Target date: 01/04/2025 patient will be independent with initial HEP and compliant with HEP 3-4 times a week   Baseline: Goal status: INITIAL  2.  Patient will report 50% improvement overall  Baseline:  Goal status: INITIAL  3.  Patient will increase knee mobility to -8 to 105 to promote normal navigation of steps; step over step pattern Baseline: see above Goal status: INITIAL  LONG TERM GOALS: Target date: 01/25/2025  Patient will be independent in self management strategies to improve quality of life and functional outcomes.  Baseline:  Goal status: INITIAL  2.  Patient will report 70% improvement overall  Baseline:  Goal status: INITIAL  3.  Patient will improve LEFS  score by 13 points (40/80) to demonstrate improved perceived function  Baseline:  Goal status: INITIAL  4.  Patient will increase distance on 2 MWT to 400 ft or moreto demonstrate improved speed and efficiency with household and community ambulation  Baseline: 358 ft Goal status: INITIAL  5.  Patient will increase knee mobility to -2 to 120 to promote normal navigation of steps; step over step pattern  Baseline:  Goal status: INITIAL  6.  Patient will improve MMT to at least 4+/5 to be able to [facility] for a couple of hours without having to rest.  Baseline:  Goal status: INITIAL   PLAN:  PT FREQUENCY: 2x/week  PT DURATION: 6 weeks  PLANNED INTERVENTIONS: [id number]- PT Re-evaluation, 97110-Therapeutic exercises, 97530- Therapeutic activity, 97112- Neuromuscular re-education, 97535- Self Care, [id number]- Manual therapy, [id number]- Gait training, [phone]- Orthotic Fit/training, [phone]- Canalith repositioning, [id number]- Aquatic Therapy, 97760- Splinting, 97597- Wound care (first 20 sq cm), 97598- Wound care (each additional 20 sq cm)Patient/Family education, Balance training, Stair training, Taping, Dry Needling, Joint mobilization, Joint manipulation, Spinal manipulation, Spinal mobilization, Scar mobilization, and DME instructions.   PLAN FOR NEXT SESSION: Continue manual therapy as need. Progress  quad control and strength interventions. Progress knee flexion and extension interventions.  Venus Galeazzi, SPT  5:17 PM, 12/17/24  Today's visit was completed with direct 1:1 supervision and direction by Lacinda Fass, PT, DPT   "

## 2024-12-20 ENCOUNTER — Ambulatory Visit (HOSPITAL_COMMUNITY)

## 2024-12-24 ENCOUNTER — Ambulatory Visit (HOSPITAL_COMMUNITY)

## 2024-12-24 ENCOUNTER — Encounter (HOSPITAL_COMMUNITY): Payer: Self-pay

## 2024-12-24 DIAGNOSIS — M6281 Muscle weakness (generalized): Secondary | ICD-10-CM

## 2024-12-24 DIAGNOSIS — M25662 Stiffness of left knee, not elsewhere classified: Secondary | ICD-10-CM

## 2024-12-24 DIAGNOSIS — M25562 Pain in left knee: Secondary | ICD-10-CM

## 2024-12-24 DIAGNOSIS — R262 Difficulty in walking, not elsewhere classified: Secondary | ICD-10-CM

## 2024-12-24 DIAGNOSIS — M1712 Unilateral primary osteoarthritis, left knee: Secondary | ICD-10-CM

## 2024-12-24 NOTE — Therapy (Addendum)
 " OUTPATIENT PHYSICAL THERAPY LOWER EXTREMITY TREATMENT   Patient Name: RANIER COACH MRN: [id number] DOB:11/29/1945, 79 y.o., male Today's Date: 12/24/2024  END OF SESSION:  PT End of Session - 12/24/24 1541     Visit Number 3    Number of Visits 12    Date for Recertification  01/25/25    Authorization Type HTA    Authorization Time Period no auth required    PT Start Time 1459    PT Stop Time 1543    PT Time Calculation (min) 44 min    Activity Tolerance Patient tolerated treatment well    Behavior During Therapy WFL for tasks assessed/performed            Past Medical History:  Diagnosis Date   Arthritis    in both knees   GERD (gastroesophageal reflux disease)    Sleep apnea    Past Surgical History:  Procedure Laterality Date   BACK SURGERY     excision of melanoma   BIOPSY  04/07/2018   Procedure: BIOPSY;  Surgeon: Harvey Margo CROME, MD;  Location: [facility];  Service: Endoscopy;;  random colon biopsy   BIOPSY  11/23/2019   Procedure: BIOPSY;  Surgeon: Harvey Margo CROME, MD;  Location: [facility];  Service: Endoscopy;;  duodenal bulb, second portion of duodenal , duodenal nodule    COLONOSCOPY WITH PROPOFOL  N/A 04/07/2018   one 4 mm hyperplastic rectal polyp, redundant left colon, external and internal hemorrhoids   ESOPHAGOGASTRODUODENOSCOPY (EGD) WITH PROPOFOL  N/A 04/07/2018   Negative Barrett's. medium sized hiatal hernia. moderate gastritis/duodenitis   ESOPHAGOGASTRODUODENOSCOPY (EGD) WITH PROPOFOL  N/A 11/23/2019   Procedure: ESOPHAGOGASTRODUODENOSCOPY (EGD) WITH PROPOFOL ;  Surgeon: Harvey Margo CROME, MD;  Location: [facility];  Service: Endoscopy;  Laterality: N/A;  11:15am   KNEE ARTHROSCOPY WITH MEDIAL MENISECTOMY Left 10/02/2017   Procedure: LEFT KNEE ARTHROSCOPY WITH PARTIAL MEDIAL MENISECTOMY;  Surgeon: Margrette Taft BRAVO, MD;  Location: [facility];  Service: Orthopedics;  Laterality: Left;   POLYPECTOMY  04/07/2018   Procedure: POLYPECTOMY;   Surgeon: Harvey Margo CROME, MD;  Location: [facility];  Service: Endoscopy;;  rectal polyp cs   TOTAL KNEE ARTHROPLASTY Left 11/30/2024   Procedure: ARTHROPLASTY, KNEE, TOTAL;  Surgeon: Margrette Taft BRAVO, MD;  Location: [facility];  Service: Orthopedics;  Laterality: Left;   Patient Active Problem List   Diagnosis Date Noted   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 12/15/2024   Primary localized osteoarthritis of knee 11/30/2024   Encounter for general adult medical examination with abnormal findings 11/03/2024   Prostate cancer screening 11/03/2024   Encounter for Medicare annual wellness exam 05/06/2024   Erectile dysfunction 12/11/2023   Chronic frontal sinusitis 10/03/2023   Elbow arthritis 10/03/2023   Mixed hyperlipidemia 10/03/2023   Uncomplicated alcohol dependence (HCC) 10/03/2023   Essential hypertension 10/03/2023   Primary osteoarthritis of left knee 10/03/2023   Diarrhea 08/25/2019   GERD (gastroesophageal reflux disease) 08/10/2018   Polyp of rectum    Change in bowel habits 03/05/2018   OSA (obstructive sleep apnea) 02/02/2018   Snoring 01/01/2018   Colon polyp 01/01/2018   Family history of colon cancer 01/01/2018   Heavy drinker of alcohol 01/01/2018   Body mass index (BMI) of 40.0-44.9 in adult [facility]) 01/01/2018   Abnormal EKG 01/01/2018   S/P left knee arthroscopy 10/02/17 10/06/2017   Seborrheic keratosis 07/25/2017    PCP: Tobie Suzzane POUR, MD  REFERRING PROVIDER: Margrette Taft BRAVO, MD  REFERRING DIAG: [phone] (ICD-10-CM) -  Primary osteoarthritis of left knee  THERAPY DIAG:  Difficulty in walking, not elsewhere classified  Knee stiffness, left  Acute pain of left knee  Muscle weakness (generalized)  Primary osteoarthritis of left knee  Rationale for Evaluation and Treatment: Rehabilitation  ONSET DATE: 11/30/24; Left TKA  SUBJECTIVE:   SUBJECTIVE STATEMENT: Patient reports no pain in the knee. He felt sore after the last session but no  significant increase in pain. He is still using the CPM machine and was able to get knee to 108 degrees of flexion with machine today.  Eval: Pt is 2 week s/p left TKA. Currently, he is in 1/10 pain. He reports that pain gets up to 4/10 after exercise. He reports that pain wakes him from sleep 1-2 times per night. If his leg twitches or he contracts too fast, his pain briefly gets up to an 8/10 but resolves quickly.He was discharged this morning from home health this morning.   PERTINENT HISTORY: Arthritis in both knees. PAIN:  Are you having pain? Yes: NPRS scale: 1-4/10 Pain location: left TKA Pain description: Sharp, achy Aggravating factors: Exercise Relieving factors: Oxycodone , Ice   PRECAUTIONS: Knee  RED FLAGS: None   WEIGHT BEARING RESTRICTIONS: No  FALLS:  Has patient fallen in last 6 months? No  LIVING ENVIRONMENT: Lives with:  Lives in: House/apartment Stairs: Yes: External: 2 steps; can reach both Internal: 9 Has following equipment at home: Vannie - 2 wheeled  OCCUPATION: Licensed conveyancer  PLOF: Independent with basic ADLs  PATIENT GOALS: Be in shop working on wood working for 8-9 hours a day  NEXT MD VISIT: 12/15/24  OBJECTIVE:  Note: Objective measures were completed at Evaluation unless otherwise noted.  DIAGNOSTIC FINDINGS:   PATIENT SURVEYS:  LEFS  Extreme difficulty/unable (0), Quite a bit of difficulty (1), Moderate difficulty (2), Little difficulty (3), No difficulty (4) Survey date:    Any of your usual work, housework or school activities   2. Usual hobbies, recreational or sporting activities   3. Getting into/out of the bath   4. Walking between rooms   5. Putting on socks/shoes   6. Squatting    7. Lifting an object, like a bag of groceries from the floor   8. Performing light activities around your home   9. Performing heavy activities around your home   10. Getting into/out of a car   11. Walking 2 blocks   12. Walking 1 mile   13.  Going up/down 10 stairs (1 flight)   14. Standing for 1 hour   15.  sitting for 1 hour   16. Running on even ground   17. Running on uneven ground   18. Making sharp turns while running fast   19. Hopping    20. Rolling over in bed   Score total:  27/80; 33.8     COGNITION: Overall cognitive status: Within functional limits for tasks assessed     SENSATION: Not tested   POSTURE: weight shift right  EDEMA: 12/17/24  Circumferential measurement at tibiofemoral joint line: Left: 46 cm Right: 41 cm   PALPATION: General soreness normal for this time s/p.  LOWER EXTREMITY ROM:  Active ROM Right eval Left eval  Hip flexion    Hip extension    Hip abduction    Hip adduction    Hip internal rotation    Hip external rotation    Knee flexion 136 98  Knee extension -2 -14  Ankle dorsiflexion    Ankle plantarflexion  Ankle inversion    Ankle eversion     (Blank rows = not tested)  LOWER EXTREMITY MMT:* sore/painful  MMT Right eval Left eval  Hip flexion 4+ 3-*  Hip extension    Hip abduction    Hip adduction    Hip internal rotation    Hip external rotation    Knee flexion    Knee extension 5 3*  Ankle dorsiflexion 5 4+*  Ankle plantarflexion    Ankle inversion    Ankle eversion     (Blank rows = not tested)   FUNCTIONAL TESTS:  2 minute walk test: 358 ft 5 time sit to stand: 13.91 sec  GAIT: Distance walked: 358 ft Assistive device utilized:   and None Level of assistance: Complete Independence Comments: Decreased step length on right foot, antalgic, wide BOS                                                                                                                                TREATMENT DATE:                                     EXERCISE LOG  Exercise Repetitions and Resistance Comments  Recumbent Bike Seat 9 x 5 minutes Rocking  TKE with 4 inch step 20 reps x 30 sec hold Bilateral UE support from parallel bars  Stairs Reciprocal  pattern  Bilateral UE support from hand rails. Hip compensation due to lack of knee flexion.  Lunge stretch 2 minutes x 6 inch step 90 degrees max  Long Arc Quad 20 reps (left) Cueing for quad contraction and control.  Seated marches 10 reps each leg x 3# Unable to achieve full ROM on left without pain.  Toe raises 20 reps Unilateral UE support from parallel bars.  Heel raises 20 reps Unilateral UE support from parallel bars.  Manual therapy 14 minutes STM to lateral and anterior thigh. Increase in tone one lateral thigh, only able to tolerate very light STM. PROM 95 degrees max. Edema measurement: 43 cm.  Tandem stance 2 x 30 sec each side No UE support needed, but unstable with left LE in the back.   Blank cell = exercise not performed today                                   12/17/24  EXERCISE LOG  Exercise Repetitions and Resistance Comments  Recumbent bike  Seat 9 x 6 minutes Not able to get full rotations. Rocking.  Lunge stretch 6 inch step x 2 minutes Bilateral UE support from parallel bars.  Step ups 4 inch step x 10 reps Bilateral UE support from parallel bars.  Manual therapy 9 minutes STM to quads, hamstring, and IT band following report of stiffness. Max PROM 95 degrees.  Edema measurement Left: 46 cm Right: 41 cm   Quad sets  10 reps Supine with towel under knee.  Heel slides with towel 15 reps seated  Toe raises 20 reps seated  Heel raises 20 reps seated  Thomas stretch 2 x 1 minute hold    Blank cell = exercise not performed today   12/14/2024 physical therapy evaluation and HEP    PATIENT EDUCATION:  Education details: Patient educated on exam findings, POC, scope of PT, HEP, and what to expect next visit. Person educated: Patient Education method: Explanation, Demonstration, and Handouts Education comprehension: verbalized understanding, returned demonstration, verbal cues required, and tactile cues required   HOME EXERCISE PROGRAM: Access Code: [email or web address] URL:  [email or web address] Date: 12/14/2024 Prepared by: [facility]  Exercises - Supine Ankle Pumps  - 3 x daily - 1 sets - 15-20 reps - Supine Quad Set  - 3 x daily - 1 sets - 15-20 reps - 3 sec hold - Supine Heel Slide with Strap  - 3 x daily - 3 sets - 15-20 reps - 3 sec hold - Small Range Straight Leg Raise  - 3 x daily - 1 sets - 10 reps - Seated Knee Flexion  - 3 x daily - 1 sets - 15 reps - 3 sec hold - Seated Long Arc Quad  - 3 x daily - 1 sets - 10 reps - 5s hold  ASSESSMENT:  CLINICAL IMPRESSION: Pt completed knee flexion mobility interventions today and was able to get to 90 degrees of AAROM with the bike and lunge stretch. He tolerated progressed quad activation interventions well today evidenced by completion of TKE on a 4 inch stair and weighted long arc quads. STM was performed to lateral and anterior thigh to address increased tone in quads and IT band. PROM was performed with max knee flexion being 95 degrees. Edema decreased as evidenced by decrease in circumference measurement since the last treatment. He was introduced to balance interventions and was able to perform tandem stance with no UE support. He reported feeling sore but no significant increase in pain at the end of treatment today. Pt will continue to benefit from skilled physical therapy to increase left knee mobility, strength, and balance to decrease functional limitations.  Eval: Patient is a 79 y.o. male who was seen today for physical therapy evaluation and treatment for M17.12 (ICD-10-CM) - Primary osteoarthritis of left knee.  Patient demonstrates muscle weakness, reduced ROM, and fascial restrictions which are likely contributing to symptoms of pain and are negatively impacting patient ability to perform ADLs and functional mobility tasks. Patient will benefit from skilled physical therapy services to address these deficits to reduce pain and improve level of function with ADLs and functional mobility  tasks.   OBJECTIVE IMPAIRMENTS: decreased balance, difficulty walking, decreased ROM, decreased strength, hypomobility, increased edema, and pain.   ACTIVITY LIMITATIONS: carrying, lifting, bending, standing, squatting, sleeping, bed mobility, dressing, and locomotion level  PARTICIPATION LIMITATIONS: meal prep, cleaning, community activity, and occupation  REHAB POTENTIAL: Good  CLINICAL DECISION MAKING: Evolving/moderate complexity  EVALUATION COMPLEXITY: Moderate   GOALS: Goals reviewed with patient? No  SHORT TERM GOALS: Target date: 01/04/2025 patient will be independent with initial HEP and compliant with HEP 3-4 times a week   Baseline: Goal status: INITIAL  2.  Patient will report 50% improvement overall  Baseline:  Goal status: INITIAL  3.  Patient will increase knee mobility to -8 to 105 to promote normal navigation of steps; step over  step pattern Baseline: see above Goal status: INITIAL  LONG TERM GOALS: Target date: 01/25/2025  Patient will be independent in self management strategies to improve quality of life and functional outcomes.  Baseline:  Goal status: INITIAL  2.  Patient will report 70% improvement overall  Baseline:  Goal status: INITIAL  3.  Patient will improve LEFS score by 13 points (40/80) to demonstrate improved perceived function  Baseline:  Goal status: INITIAL  4.  Patient will increase distance on 2 MWT to 400 ft or moreto demonstrate improved speed and efficiency with household and community ambulation  Baseline: 358 ft Goal status: INITIAL  5.  Patient will increase knee mobility to -2 to 120 to promote normal navigation of steps; step over step pattern  Baseline:  Goal status: INITIAL  6.  Patient will improve MMT to at least 4+/5 to be able to [facility] for a couple of hours without having to rest.  Baseline:  Goal status: INITIAL   PLAN:  PT FREQUENCY: 2x/week  PT DURATION: 6 weeks  PLANNED INTERVENTIONS:  [id number]- PT Re-evaluation, 97110-Therapeutic exercises, 97530- Therapeutic activity, 97112- Neuromuscular re-education, 97535- Self Care, [id number]- Manual therapy, [id number]- Gait training, [phone]- Orthotic Fit/training, [phone]- Canalith repositioning, [id number]- Aquatic Therapy, 97760- Splinting, 97597- Wound care (first 20 sq cm), 97598- Wound care (each additional 20 sq cm)Patient/Family education, Balance training, Stair training, Taping, Dry Needling, Joint mobilization, Joint manipulation, Spinal manipulation, Spinal mobilization, Scar mobilization, and DME instructions.   PLAN FOR NEXT SESSION: Continue to progress left knee mobility and quad control interventions. Continue manual therapy as needed. Begin gait and stair training.  Venus Galeazzi, SPT  4:25 PM, 12/24/24  Today's visit was completed with direct 1:1 supervision and direction by Lacinda Fass, PT, DPT   "

## 2024-12-27 ENCOUNTER — Ambulatory Visit (HOSPITAL_COMMUNITY)

## 2024-12-29 ENCOUNTER — Ambulatory Visit (HOSPITAL_COMMUNITY)

## 2025-01-03 ENCOUNTER — Ambulatory Visit (HOSPITAL_COMMUNITY)

## 2025-01-05 ENCOUNTER — Ambulatory Visit (HOSPITAL_COMMUNITY)

## 2025-01-05 ENCOUNTER — Encounter: Admitting: Orthopedic Surgery

## 2025-01-10 ENCOUNTER — Ambulatory Visit (HOSPITAL_COMMUNITY)

## 2025-01-14 ENCOUNTER — Ambulatory Visit (HOSPITAL_COMMUNITY)

## 2025-01-19 ENCOUNTER — Ambulatory Visit (HOSPITAL_COMMUNITY)

## 2025-01-21 ENCOUNTER — Ambulatory Visit (HOSPITAL_COMMUNITY)

## 2025-01-24 ENCOUNTER — Ambulatory Visit (HOSPITAL_COMMUNITY)

## 2025-01-28 ENCOUNTER — Ambulatory Visit (HOSPITAL_COMMUNITY)

## 2025-01-31 ENCOUNTER — Ambulatory Visit (HOSPITAL_COMMUNITY)

## 2025-02-02 ENCOUNTER — Ambulatory Visit: Payer: Self-pay | Admitting: Internal Medicine

## 2025-02-04 ENCOUNTER — Ambulatory Visit (HOSPITAL_COMMUNITY)

## 2025-05-11 ENCOUNTER — Ambulatory Visit
